# Patient Record
Sex: Male | Born: 1975 | Hispanic: Yes | Marital: Single | Smoking: Current every day smoker
Health system: Southern US, Community
[De-identification: ages and names within clinical notes are randomized; demographics above are authoritative.]

## PROBLEM LIST (undated history)

## (undated) DIAGNOSIS — M545 Low back pain, unspecified: Secondary | ICD-10-CM

## (undated) DIAGNOSIS — R011 Cardiac murmur, unspecified: Secondary | ICD-10-CM

## (undated) DIAGNOSIS — K219 Gastro-esophageal reflux disease without esophagitis: Secondary | ICD-10-CM

## (undated) DIAGNOSIS — K279 Peptic ulcer, site unspecified, unspecified as acute or chronic, without hemorrhage or perforation: Secondary | ICD-10-CM

## (undated) DIAGNOSIS — Z8614 Personal history of Methicillin resistant Staphylococcus aureus infection: Secondary | ICD-10-CM

## (undated) HISTORY — PX: APPENDECTOMY: SHX54

---

## 2006-03-24 ENCOUNTER — Other Ambulatory Visit: Payer: Self-pay

## 2006-03-24 ENCOUNTER — Emergency Department: Payer: Self-pay | Admitting: Emergency Medicine

## 2006-09-11 ENCOUNTER — Emergency Department: Payer: Self-pay | Admitting: Emergency Medicine

## 2007-12-25 ENCOUNTER — Emergency Department: Payer: Self-pay | Admitting: Emergency Medicine

## 2007-12-25 ENCOUNTER — Other Ambulatory Visit: Payer: Self-pay

## 2012-06-22 ENCOUNTER — Emergency Department: Payer: Self-pay | Admitting: Emergency Medicine

## 2012-08-22 DIAGNOSIS — Z8614 Personal history of Methicillin resistant Staphylococcus aureus infection: Secondary | ICD-10-CM

## 2012-08-22 HISTORY — DX: Personal history of Methicillin resistant Staphylococcus aureus infection: Z86.14

## 2013-09-13 ENCOUNTER — Emergency Department: Payer: Self-pay | Admitting: Emergency Medicine

## 2013-09-13 LAB — URINALYSIS, COMPLETE
BLOOD: NEGATIVE
Bacteria: NONE SEEN
Bilirubin,UR: NEGATIVE
GLUCOSE, UR: NEGATIVE mg/dL (ref 0–75)
Ketone: NEGATIVE
Leukocyte Esterase: NEGATIVE
NITRITE: NEGATIVE
Ph: 6 (ref 4.5–8.0)
Protein: NEGATIVE
Specific Gravity: 1.018 (ref 1.003–1.030)
Squamous Epithelial: <1
WBC UR: 3 /HPF (ref 0–5)

## 2013-09-13 LAB — COMPREHENSIVE METABOLIC PANEL
AST: 9 U/L — AB (ref 15–37)
Albumin: 4.2 g/dL (ref 3.4–5.0)
Alkaline Phosphatase: 48 U/L
Anion Gap: 1 — ABNORMAL LOW (ref 7–16)
BUN: 8 mg/dL (ref 7–18)
Bilirubin,Total: 0.8 mg/dL (ref 0.2–1.0)
CHLORIDE: 105 mmol/L (ref 98–107)
CO2: 32 mmol/L (ref 21–32)
CREATININE: 1.07 mg/dL (ref 0.60–1.30)
Calcium, Total: 8.8 mg/dL (ref 8.5–10.1)
EGFR (Non-African Amer.): >60
GLUCOSE: 92 mg/dL (ref 65–99)
Osmolality: 274 (ref 275–301)
Potassium: 3.9 mmol/L (ref 3.5–5.1)
SGPT (ALT): 25 U/L (ref 12–78)
Sodium: 138 mmol/L (ref 136–145)
TOTAL PROTEIN: 7.7 g/dL (ref 6.4–8.2)

## 2013-09-13 LAB — LIPASE, BLOOD: LIPASE: 139 U/L (ref 73–393)

## 2013-11-04 ENCOUNTER — Emergency Department: Payer: Self-pay | Admitting: Emergency Medicine

## 2013-11-04 LAB — COMPREHENSIVE METABOLIC PANEL
Albumin: 3.8 g/dL (ref 3.4–5.0)
Alkaline Phosphatase: 52 U/L
Anion Gap: 1 — ABNORMAL LOW (ref 7–16)
BILIRUBIN TOTAL: 0.3 mg/dL (ref 0.2–1.0)
BUN: 10 mg/dL (ref 7–18)
CHLORIDE: 107 mmol/L (ref 98–107)
Calcium, Total: 8.3 mg/dL — ABNORMAL LOW (ref 8.5–10.1)
Co2: 31 mmol/L (ref 21–32)
Creatinine: 1.11 mg/dL (ref 0.60–1.30)
EGFR (African American): >60
EGFR (Non-African Amer.): >60
Glucose: 100 mg/dL — ABNORMAL HIGH (ref 65–99)
Osmolality: 277 (ref 275–301)
Potassium: 4.1 mmol/L (ref 3.5–5.1)
SGOT(AST): 16 U/L (ref 15–37)
SGPT (ALT): 21 U/L (ref 12–78)
Sodium: 139 mmol/L (ref 136–145)
Total Protein: 7.1 g/dL (ref 6.4–8.2)

## 2013-11-04 LAB — URINALYSIS, COMPLETE
BACTERIA: NONE SEEN
BLOOD: NEGATIVE
Bilirubin,UR: NEGATIVE
Glucose,UR: NEGATIVE mg/dL (ref 0–75)
KETONE: NEGATIVE
LEUKOCYTE ESTERASE: NEGATIVE
Nitrite: NEGATIVE
Ph: 5 (ref 4.5–8.0)
Protein: NEGATIVE
RBC,UR: 2 /HPF (ref 0–5)
Specific Gravity: 1.025 (ref 1.003–1.030)
WBC UR: 1 /HPF (ref 0–5)

## 2013-11-04 LAB — CBC WITH DIFFERENTIAL/PLATELET
Basophil #: 0.1 10*3/uL (ref 0.0–0.1)
Basophil %: 0.6 %
Eosinophil #: 0.6 10*3/uL (ref 0.0–0.7)
Eosinophil %: 4.8 %
HCT: 45.5 % (ref 40.0–52.0)
HGB: 15.3 g/dL (ref 13.0–18.0)
LYMPHS PCT: 21.1 %
Lymphocyte #: 2.5 10*3/uL (ref 1.0–3.6)
MCH: 32.2 pg (ref 26.0–34.0)
MCHC: 33.5 g/dL (ref 32.0–36.0)
MCV: 96 fL (ref 80–100)
MONO ABS: 0.8 x10 3/mm (ref 0.2–1.0)
MONOS PCT: 6.7 %
NEUTROS PCT: 66.8 %
Neutrophil #: 7.9 10*3/uL — ABNORMAL HIGH (ref 1.4–6.5)
PLATELETS: 250 10*3/uL (ref 150–440)
RBC: 4.74 10*6/uL (ref 4.40–5.90)
RDW: 13 % (ref 11.5–14.5)
WBC: 11.8 10*3/uL — ABNORMAL HIGH (ref 3.8–10.6)

## 2014-06-24 ENCOUNTER — Emergency Department: Payer: Self-pay | Admitting: Emergency Medicine

## 2015-06-10 ENCOUNTER — Emergency Department
Admission: EM | Admit: 2015-06-10 | Discharge: 2015-06-11 | Disposition: A | Discharge status: Home / Self Care | Payer: Self-pay | Attending: Emergency Medicine | Admitting: Emergency Medicine

## 2015-06-10 ENCOUNTER — Encounter: Payer: Self-pay | Admitting: *Deleted

## 2015-06-10 DIAGNOSIS — Z720 Tobacco use: Secondary | ICD-10-CM | POA: Insufficient documentation

## 2015-06-10 DIAGNOSIS — Y9389 Activity, other specified: Secondary | ICD-10-CM | POA: Insufficient documentation

## 2015-06-10 DIAGNOSIS — T675XXA Heat exhaustion, unspecified, initial encounter: Secondary | ICD-10-CM | POA: Insufficient documentation

## 2015-06-10 DIAGNOSIS — Y998 Other external cause status: Secondary | ICD-10-CM | POA: Insufficient documentation

## 2015-06-10 DIAGNOSIS — X30XXXA Exposure to excessive natural heat, initial encounter: Secondary | ICD-10-CM | POA: Insufficient documentation

## 2015-06-10 DIAGNOSIS — Y9289 Other specified places as the place of occurrence of the external cause: Secondary | ICD-10-CM | POA: Insufficient documentation

## 2015-06-10 HISTORY — DX: Peptic ulcer, site unspecified, unspecified as acute or chronic, without hemorrhage or perforation: K27.9

## 2015-06-10 MED ORDER — SODIUM CHLORIDE 0.9 % IV BOLUS (SEPSIS)
1000.0000 mL | Freq: Once | INTRAVENOUS | Status: AC
  Administered 2015-06-10: 1000 mL via INTRAVENOUS

## 2015-06-10 NOTE — ED Provider Notes (Signed)
[facility] Emergency Department Provider Note  ____________________________________________   I have reviewed the triage vital signs and the nursing notes.   HISTORY  Chief Complaint Dizziness    HPI Nicolas Tapia is a 39 y.o. 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 Did not fall, his friend was with him. He sagged and woke up immediately. No seizure. No postictal period at this time he feels much better. He did have some nausea, he states he was feeling overheated before it happened but otherwise he was in his normal state of health. He is a healthy person, he denies any chest pain shows breath nausea vomiting or melena bright red blood per rectum or focal neurologic symptoms. He denies headache. Patient states he is a free mason and he is adamant that I am not allowed to draw any blood because his blood is sacred  Past Medical History  Diagnosis Date  . Peptic ulcer     There are no active problems to display for this patient.   Past Surgical History  Procedure Laterality Date  . Appendectomy      No current outpatient prescriptions on file.  Allergies Review of patient's allergies indicates no known allergies.  No family history on file.  Social History Social History  Substance Use Topics  . Smoking status: Current Every Day Smoker  . Smokeless tobacco: None  . Alcohol Use: No    Review of Systems Constitutional: No fever/chills Eyes: No visual changes. ENT: No sore throat. No stiff neck no neck pain Cardiovascular: Denies chest pain. Respiratory: Denies shortness of breath. Gastrointestinal:  Positivevomiting.  No diarrhea.  No constipation. Genitourinary: Negative for dysuria. Musculoskeletal: Negative lower extremity swelling Skin: Negative for rash. Neurological: Negative for headaches, focal weakness or numbness. 10-point ROS otherwise  negative.  ____________________________________________   PHYSICAL EXAM:  VITAL SIGNS: ED Triage Vitals  Enc Vitals Group     BP 06/10/15 1639 132/84 mmHg     Pulse Rate 06/10/15 1639 84     Resp 06/10/15 1639 18     Temp 06/10/15 1639 98.2 F (36.8 C)     Temp Source 06/10/15 1639 Oral     SpO2 06/10/15 1639 99 %     Weight --      Height --      Head Cir --      Peak Flow --      Pain Score 06/10/15 1637 7     Pain Loc --      Pain Edu? --      Excl. in GC? --     Constitutional: Alert and oriented. Well appearing and in no acute distress. Eyes: Conjunctivae are normal. PERRL. EOMI. Head: Atraumatic. Nose: No congestion/rhinnorhea. Mouth/Throat: Mucous membranes are moist.  Oropharynx non-erythematous. Neck: No stridor.   Nontender with no meningismus Cardiovascular: Normal rate, regular rhythm. Grossly normal heart sounds.  Good peripheral circulation. Respiratory: Normal respiratory effort.  No retractions. Lungs CTAB. Gastrointestinal: Soft and nontender. No distention. No guarding no rebound Back:  There is no focal tenderness or step off there is no midline tenderness there are no lesions noted. there is no CVA tenderness Musculoskeletal: No lower extremity tenderness. No joint effusions, no DVT signs strong distal pulses no edema Neurologic:  Normal speech and language. No gross focal neurologic deficits are appreciated.  Skin:  Skin is warm, dry and intact. No rash noted. Psychiatric: Mood  and affect are normal. Speech and behavior are normal.  ____________________________________________   LABS (all labs ordered are listed, but only abnormal results are displayed)  Labs Reviewed  BASIC METABOLIC PANEL  CBC  URINALYSIS COMPLETEWITH MICROSCOPIC ([facility] ONLY)  CBG MONITORING, ED    ____________________________________________  EKG   ____________________________________________  RADIOLOGY   ____________________________________________   PROCEDURES  Procedure(s) performed: None  Critical Care performed: None  ____________________________________________   INITIAL IMPRESSION / ASSESSMENT AND PLAN / ED COURSE  Pertinent labs & imaging results that were available during my care of the patient were reviewed by me and considered in my medical decision making (see chart for details).   patient has symptoms of heat exhaustion, other pathologies or possible however he refuses any blood draws which means I cannot rule out for example anemia or cardiac enzyme elevation etc. I have low suspicion for these entities in any event. Nonetheless as he refuses blood work I have explained him that this does limit my ability to evaluate him and diagnosed him with possibly significant pathology that could cause him to feel lightheaded and vomited at work. Patient's very comfortable with the limitations of this workup and he does consent to having IV fluid which we'll provide. He is otherwise in no acute distress. And he feels much better after getting out of the heat.  ___________________________________________   FINAL CLINICAL IMPRESSION(S) / ED DIAGNOSES  Final diagnoses:  None     Jeanmarie PlantJames A Damira Kem, MD 06/10/15 [id number]

## 2015-06-10 NOTE — Discharge Instructions (Signed)
Heat Exhaustion Information Our workup here is limited because you declined to have any blood drawn if you feel worse or change your mind return to the emergency department.  WHAT IS HEAT EXHAUSTION? Heat exhaustion happens when your body gets overheated from hot weather or from exercise. Heat exhaustion makes the temperature of your skin and body go up. Your body cools itself by sweating. If you do not drink enough water to replace what you sweat, you lose too much water and salt. This makes it harder for your body to produce more sweat. When you do not sweat enough, your body cannot cool down, and heat exhaustion may result. Heat exhaustion can lead to heatstroke, which is a more serious illness. WHO IS AT RISK FOR HEAT EXHAUSTION? Anyone can get heat exhaustion. However, heat exhaustion is more likely when you are exercising or doing a physical activity. It is also more likely when you are in hot and humid weather or bright sunshine. Heat exhaustion is also more likely to develop in:  People who are age 72 or older.  Children.  People who have a medical condition such as heart disease, poor circulation, sickle cell disease, or high blood pressure.  People who have a fever.  People who are very overweight (obese).  People who are dehydrated from:  Drinking alcohol or caffeine.  Taking certain medicines, such as diuretics or stimulants. WHAT ARE THE SYMPTOMS OF HEAT EXHAUSTION? Symptoms of heat exhaustion include:  A body temperature of up to 104F (40C).  Moist, cool, and clammy skin.  Dizziness.  Headache.  Nausea.  Fatigue.  Thirst.  Dark-colored urine.  Rapid pulse or heartbeat.  Weakness.  Muscle cramps.  Confusion.  Fainting. WHAT SHOULD I DO IF I THINK I HAVE HEAT EXHAUSTION? If you think that you have heat exhaustion, call your health care provider. Follow his or her instructions. You should also:  Call a friend or a family member and ask someone to  stay with you.  Move to a cooler location, such as:  Into the shade.  In front of a fan.  Someplace that has air conditioning.  Lie down and rest.  Slowly drink nonalcoholic, caffeine-free fluids.  Take off any extra clothing or tight-fitting clothes.  Take a cool bath or shower, if possible. If you do not have access to a bath or shower, dab or mist cool water on your skin. WHY IS IT IMPORTANT TO TREAT HEAT EXHAUSTION? It is extremely important to take care of yourself and treat heat exhaustion as soon as possible. Untreated heat exhaustion can turn into heatstroke. Symptoms of heatstroke include:  A body temperature of 104F (40C) or higher.  Hot, red skin that may be dry or moist.  Severe headache.  Nausea and vomiting.  Muscle weakness and cramping.  Confusion.  Rapid breathing.  Fainting.  Seizure. These symptoms may represent a serious problem that is an emergency. Do not wait to see if the symptoms will go away. Get medical help right away. Call your local emergency services (911 in the [place] Do not drive yourself to the hospital. Heatstroke is a life-threatening condition that requires urgent medical treatment. Do not treat heatstroke at home. Heatstroke should be treated by a health care professional and may require hospitalization. At the hospital, you may need to receive fluids through an IV tube:  If you cannot drink any fluids.  If you vomit any fluids that you drink.  If your symptoms do not get better after one  hour.  If your symptoms get worse after one hour. HOW CAN I PREVENT HEAT EXHAUSTION?  Avoid outdoor activities on very hot or humid days.  Do not exercise or do other physical activity when you are not feeling well.  Drink plenty of nonalcoholic and caffeine-free fluids before and during physical activity.  Take frequent breaks for rest during physical activity.  Wear light-colored, loose-fitting, and lightweight clothing in the  heat.  Wear a hat and use sunscreen when exercising outdoors.  Avoid being outside during the hottest times of the day.  Check with your health care provider before you start any new activity, especially if you take medicine or have a medical condition.  Start any new activity slowly and work up to your fitness level. HOW CAN I HELP TO PROTECT ELDERLY RELATIVES AND NEIGHBORS FROM HEAT EXHAUSTION? People who are age 40 or older are at greater risk for heat exhaustion. Their bodies have a harder time adjusting to heat. They are also more likely to have a medical condition or be on medicines that increase their risk for heat exhaustion. They may get heat exhaustion indoors if the heat is high for several days. You can help to protect them during hot weather by:  Checking on them two or more times each day.  Making sure that they are drinking plenty of cool, nonalcoholic, and caffeine-free fluids.  Making sure that they use their air conditioner.  Taking them to a location where air conditioning is available.  Talking with their health care provider about their medical needs, medicines, and fluid requirements.   This information is not intended to replace advice given to you by your health care provider. Make sure you discuss any questions you have with your health care provider.   Document Released: 05/17/2008 Document Revised: 04/29/2015 Document Reviewed: 07/16/2014 Elsevier Interactive Patient Education [employer]2016 Else[employer] Inc.

## 2015-06-10 NOTE — ED Notes (Signed)
Pt reports getting very hot today at work and feeling weak and dizzy. Denies nausea and vomiting. Denies LOC. States that he has some mild abdominal pain. Pt reports history of peptic ulcer disease.

## 2015-06-10 NOTE — ED Notes (Signed)
Pt reports dizziness while working outside. Pain in abdomen, generalized. Nausea and vomiting.

## 2015-06-10 NOTE — ED Notes (Signed)
Pt refusing to have blood drawn. States "a mason's blood is sacred". Wants to talk to MD prior to having blood drawn.

## 2016-06-13 ENCOUNTER — Emergency Department
Admission: EM | Admit: 2016-06-13 | Discharge: 2016-06-13 | Disposition: A | Discharge status: Home / Self Care | Payer: Self-pay | Attending: Emergency Medicine | Admitting: Emergency Medicine

## 2016-06-13 ENCOUNTER — Encounter: Payer: Self-pay | Admitting: Emergency Medicine

## 2016-06-13 ENCOUNTER — Emergency Department: Payer: Self-pay

## 2016-06-13 DIAGNOSIS — F1290 Cannabis use, unspecified, uncomplicated: Secondary | ICD-10-CM | POA: Insufficient documentation

## 2016-06-13 DIAGNOSIS — R55 Syncope and collapse: Secondary | ICD-10-CM | POA: Insufficient documentation

## 2016-06-13 DIAGNOSIS — F17200 Nicotine dependence, unspecified, uncomplicated: Secondary | ICD-10-CM | POA: Insufficient documentation

## 2016-06-13 LAB — URINALYSIS COMPLETE WITH MICROSCOPIC (ARMC ONLY)
BILIRUBIN URINE: NEGATIVE
Bacteria, UA: NONE SEEN
GLUCOSE, UA: NEGATIVE mg/dL
Hgb urine dipstick: NEGATIVE
KETONES UR: NEGATIVE mg/dL
Leukocytes, UA: NEGATIVE
Nitrite: NEGATIVE
Protein, ur: NEGATIVE mg/dL
SQUAMOUS EPITHELIAL / LPF: NONE SEEN
Specific Gravity, Urine: 1.018 (ref 1.005–1.030)
pH: 6 (ref 5.0–8.0)

## 2016-06-13 LAB — BASIC METABOLIC PANEL
Anion gap: 5 (ref 5–15)
BUN: 14 mg/dL (ref 6–20)
CALCIUM: 8.8 mg/dL — AB (ref 8.9–10.3)
CO2: 27 mmol/L (ref 22–32)
CREATININE: 0.93 mg/dL (ref 0.61–1.24)
Chloride: 108 mmol/L (ref 101–111)
GFR calc non Af Amer: >60 mL/min (ref >60)
Glucose, Bld: 71 mg/dL (ref 65–99)
Potassium: 4.6 mmol/L (ref 3.5–5.1)
Sodium: 140 mmol/L (ref 135–145)

## 2016-06-13 LAB — CBC
HCT: 44.8 % (ref 40.0–52.0)
Hemoglobin: 15.4 g/dL (ref 13.0–18.0)
MCH: 33.2 pg (ref 26.0–34.0)
MCHC: 34.5 g/dL (ref 32.0–36.0)
MCV: 96.1 fL (ref 80.0–100.0)
PLATELETS: 240 10*3/uL (ref 150–440)
RBC: 4.66 MIL/uL (ref 4.40–5.90)
RDW: 13.3 % (ref 11.5–14.5)
WBC: 8.8 10*3/uL (ref 3.8–10.6)

## 2016-06-13 LAB — CK: Total CK: 77 U/L (ref 49–397)

## 2016-06-13 LAB — TROPONIN I: Troponin I: <0.03 ng/mL (ref <0.03)

## 2016-06-13 LAB — GLUCOSE, CAPILLARY: Glucose-Capillary: 125 mg/dL — ABNORMAL HIGH (ref 65–99)

## 2016-06-13 MED ORDER — SODIUM CHLORIDE 0.9 % IV BOLUS (SEPSIS)
30.0000 mL/kg | Freq: Once | INTRAVENOUS | Status: AC
  Administered 2016-06-13: 2244 mL via INTRAVENOUS

## 2016-06-13 NOTE — ED Notes (Signed)
Patient transported to CT 

## 2016-06-13 NOTE — ED Provider Notes (Signed)
[facility] Emergency Department Provider Note   ____________________________________________   First MD Initiated Contact with Patient 06/13/16 1545     (approximate)  I have reviewed the triage vital signs and the nursing notes.   HISTORY  Chief Complaint Loss of Consciousness    HPI Nicolas Tapia is a 40 y.o. male who reports Saturday he had been out with friends had 2 or 3 alcoholic drinks and smokes marijuana. He is at the bar had another drink which was much much stronger than it was supposed to be. He had just a little bit of it and then pushed it away. He then got very hungry and wanted to leave but the ladies he was with were busy talking and wouldn't leave then he got tingling in his 4 head that went up over his scalp down to his neck and he passed out. He reports he was told he got very pale and was a little bit sweaty. Is weak and achy all over. He said he maybe for 5 or 10 minutes. He has a little bit of epigastric pain. He says he has a peptic ulcer that feels like what it is. He has not had any black tarry stools or blood in his stools.  Past Medical History:  Diagnosis Date  . Peptic ulcer     There are no active problems to display for this patient.   Past Surgical History:  Procedure Laterality Date  . APPENDECTOMY      Prior to Admission medications   Not on File    Allergies Review of patient's allergies indicates no known allergies.  No family history on file.  Social History Social History  Substance Use Topics  . Smoking status: Current Every Day Smoker  . Smokeless tobacco: Never Used  . Alcohol use Yes    Review of Systems Constitutional: No fever/chills Eyes: No visual changes. ENT: No sore throat. Cardiovascular: Denies chest pain. Respiratory: Denies shortness of breath. Gastrointestinal: See history of present illness  No nausea, no vomiting.  No diarrhea.  No constipation. Genitourinary: Negative for  dysuria. Musculoskeletal: Negative for back pain. Skin: Negative for rash. Neurological: Negative for headaches, focal weakness or numbness.  10-point ROS otherwise negative.  ____________________________________________   PHYSICAL EXAM:  VITAL SIGNS: ED Triage Vitals  Enc Vitals Group     BP 06/13/16 1248 124/87     Pulse Rate 06/13/16 1248 63     Resp 06/13/16 1248 18     Temp 06/13/16 1248 97.9 F (36.6 C)     Temp Source 06/13/16 1248 Oral     SpO2 06/13/16 1248 100 %     Weight 06/13/16 1249 165 lb (74.8 kg)     Height 06/13/16 1249 5\' 10"  (1.778 m)     Head Circumference --      Peak Flow --      Pain Score 06/13/16 1249 8     Pain Loc --      Pain Edu? --      Excl. in GC? --     Constitutional: Alert and oriented. Well appearing and in no acute distress. Eyes: Conjunctivae are normal. PERRL. EOMI.Fundi appear normal Head: Atraumatic. Nose: No congestion/rhinnorhea. Mouth/Throat: Mucous membranes are moist.  Oropharynx non-erythematous. Neck: No stridor.  No cervical spine tenderness to palpation. Hematological/Lymphatic/Immunilogical: No cervical lymphadenopathy. Cardiovascular: Normal rate, regular rhythm. Grossly normal heart sounds.  Good peripheral circulation. Respiratory: Normal respiratory effort.  No retractions. Lungs CTAB. Gastrointestinal: Soft and nontender.  No distention. No abdominal bruits. No CVA tenderness. Musculoskeletal: No lower extremity tenderness nor edema.  No joint effusions. Patient does have some pain in the left flank over the paraspinous muscles laterally. There is no ecchymosis there. There is no pain anywhere else. There is no pain on palpation anteriorly. Neurologic:  Normal speech and language. No gross focal neurologic deficits are appreciated. Cranial nerves II through XII are intact cerebellar finger-nose rapid alternating movements and hands are normal motor strength is 5 over 5 throughout sensation is intact throughout No gait  instability. Skin:  Skin is warm, dry and intact. No rash noted. Psychiatric: Mood and affect are normal. Speech and behavior are normal.  ____________________________________________   LABS (all labs ordered are listed, but only abnormal results are displayed)  Labs Reviewed  GLUCOSE, CAPILLARY - Abnormal; Notable for the following:       Result Value   Glucose-Capillary 125 (*)    All other components within normal limits  BASIC METABOLIC PANEL - Abnormal; Notable for the following:    Calcium 8.8 (*)    All other components within normal limits  URINALYSIS COMPLETEWITH MICROSCOPIC ([facility] ONLY) - Abnormal; Notable for the following:    Color, Urine YELLOW (*)    APPearance CLEAR (*)    All other components within normal limits  CBC  TROPONIN I  CK   ____________________________________________  EKG  KG read and interpreted by me shows normal sinus rhythm at 64 normal axis. There is apparently early repolarization the lateral chest leads. EKG looks similar to previous one. ____________________________________________  RADIOLOGY  Study Result   CLINICAL DATA:  40 year old male with history of syncopal episode 2 days ago.  EXAM: CT HEAD WITHOUT CONTRAST  TECHNIQUE: Contiguous axial images were obtained from the base of the skull through the vertex without intravenous contrast.  COMPARISON:  Head CT 03/24/2006.  FINDINGS: Brain: No evidence of acute infarction, hemorrhage, hydrocephalus, extra-axial collection or mass lesion/mass effect.  Vascular: No hyperdense vessel or unexpected calcification.  Skull: Normal. Negative for fracture or focal lesion.  Sinuses/Orbits: Multifocal mucosal thickening and opacification in the ethmoid sinuses bilaterally.  Other: None.  IMPRESSION: 1. No acute intracranial abnormalities. 2. Paranasal sinus disease, as above, without acute features.   Electronically Signed   By: Trudie Reedaniel  Entrikin M.D.   On:  06/13/2016 16:06    Study Result   CLINICAL DATA:  Syncope.  EXAM: CHEST  2 VIEW  COMPARISON:  05/13/2002 report.  FINDINGS: Mediastinum hilar structures normal. Lungs are clear. No pleural effusion or pneumothorax. Heart size normal. No acute bony abnormality.  IMPRESSION: No acute cardiopulmonary disease.   Electronically Signed   By: Maisie Fushomas  Register   On: 06/13/2016 16:09     ____________________________________________   PROCEDURES  Procedure(s) performed:   Procedures  Critical Care performed:   ____________________________________________   INITIAL IMPRESSION / ASSESSMENT AND PLAN / ED COURSE  Pertinent labs & imaging results that were available during my care of the patient were reviewed by me and considered in my medical decision making (see chart for details).    Clinical Course   At present patient feels fine. He still little bit achy.  ____________________________________________   FINAL CLINICAL IMPRESSION(S) / ED DIAGNOSES  Final diagnoses:  Syncope, unspecified syncope type      NEW MEDICATIONS STARTED DURING THIS VISIT:  There are no discharge medications for this patient.    Note:  This document was prepared using Dragon voice recognition software and may include unintentional  dictation errors.    Arnaldo Natal, MD 06/13/16 [id number]

## 2016-06-13 NOTE — ED Notes (Signed)
Pt in via triage with complaints of LOC Saturday night while he was out at a bar, reports feeling tingly feeling across forehead and then passing out.  Pt reports feeling weak since then, not having the energy he typically does.  Pt A/Ox4, vitals WDL, no immediate distress at this time.

## 2016-06-13 NOTE — Discharge Instructions (Signed)
Try to avoid drinking so much. He careful at the marijuana use is not mixed with anything else. Remember the monitor and marijuana is much stronger than the old stuff and can cause many side effects. Her had been reports of heart attacks with marijuana nowadays.

## 2016-06-13 NOTE — ED Triage Notes (Signed)
Pt presents to ED with reports of having a syncopal episode Saturday night. Pt reports he had 2-3 alcoholic drinks and smoked some marijuana. Pt reports while out with friends he suddenly felt very hungry and then felt needle sensations from his forehead to the back of his head and then he passed out. Pt reports he was out for about five minutes. Pt denies any other episodes of passing out. Pt states he just feels weak and wanted to get checked out.

## 2016-06-13 NOTE — ED Notes (Signed)
Pt refuses to give urine specimen.  Per ED Medic pt states, "I am not going to give a urine sample."  Upon assessing the situation, pt states, "I'm just not going to be able to pee."  MD notified.

## 2016-06-22 ENCOUNTER — Emergency Department
Admission: EM | Admit: 2016-06-22 | Discharge: 2016-06-22 | Disposition: A | Discharge status: Home / Self Care | Payer: Self-pay | Attending: Emergency Medicine | Admitting: Emergency Medicine

## 2016-06-22 ENCOUNTER — Encounter: Payer: Self-pay | Admitting: *Deleted

## 2016-06-22 ENCOUNTER — Emergency Department: Payer: Self-pay

## 2016-06-22 DIAGNOSIS — M545 Low back pain: Secondary | ICD-10-CM | POA: Insufficient documentation

## 2016-06-22 DIAGNOSIS — F17200 Nicotine dependence, unspecified, uncomplicated: Secondary | ICD-10-CM | POA: Insufficient documentation

## 2016-06-22 DIAGNOSIS — R0781 Pleurodynia: Secondary | ICD-10-CM | POA: Insufficient documentation

## 2016-06-22 DIAGNOSIS — M546 Pain in thoracic spine: Secondary | ICD-10-CM | POA: Insufficient documentation

## 2016-06-22 LAB — URINALYSIS COMPLETE WITH MICROSCOPIC (ARMC ONLY)
Bacteria, UA: NONE SEEN
Bilirubin Urine: NEGATIVE
Glucose, UA: NEGATIVE mg/dL
HGB URINE DIPSTICK: NEGATIVE
KETONES UR: NEGATIVE mg/dL
LEUKOCYTES UA: NEGATIVE
NITRITE: NEGATIVE
PH: 5 (ref 5.0–8.0)
PROTEIN: NEGATIVE mg/dL
SPECIFIC GRAVITY, URINE: 1.027 (ref 1.005–1.030)

## 2016-06-22 MED ORDER — CYCLOBENZAPRINE HCL 5 MG PO TABS: 5.0000 mg | ORAL_TABLET | Freq: Three times a day (TID) | ORAL | 0 refills | Status: DC | PRN

## 2016-06-22 MED ORDER — NAPROXEN 500 MG PO TABS: 500.0000 mg | ORAL_TABLET | Freq: Two times a day (BID) | ORAL | 0 refills | Status: DC

## 2016-06-22 MED ORDER — HYDROCODONE-ACETAMINOPHEN 5-325 MG PO TABS: 1.0000 | ORAL_TABLET | ORAL | 0 refills | Status: DC | PRN

## 2016-06-22 MED ORDER — DIAZEPAM 2 MG PO TABS
2.0000 mg | ORAL_TABLET | Freq: Once | ORAL | Status: AC
  Administered 2016-06-22: 2 mg via ORAL
  Filled 2016-06-22: qty 1

## 2016-06-22 MED ORDER — KETOROLAC TROMETHAMINE 30 MG/ML IJ SOLN
30.0000 mg | Freq: Once | INTRAMUSCULAR | Status: AC
  Administered 2016-06-22: 30 mg via INTRAMUSCULAR
  Filled 2016-06-22: qty 1

## 2016-06-22 MED ORDER — HYDROCODONE-ACETAMINOPHEN 5-325 MG PO TABS
2.0000 | ORAL_TABLET | Freq: Once | ORAL | Status: AC
  Administered 2016-06-22: 2 via ORAL
  Filled 2016-06-22: qty 2

## 2016-06-22 NOTE — ED Provider Notes (Signed)
[facility] Emergency Department Provider Note  ____________________________________________   First MD Initiated Contact with Patient 06/22/16 1016     (approximate)  I have reviewed the triage vital signs and the nursing notes.   HISTORY  Chief Complaint Back Pain   HPI Nicolas Tapia is a 40 y.o. male is here with complaint of right-sided back pain for several days. Patient states that he has not had a injury that would cause his symptoms. Patient states that his pain is much worse with specific movements. He denies any urinary symptoms, incontinence of bowel or bladder or paresthesias. Patient tried over-the-counter medication without any relief. He denies any previous problems with his back. Patient is continued to be ambulatory without assistance prior to his arrival in the emergency room. He rates his pain at 10 over 10.   Past Medical History:  Diagnosis Date  . Peptic ulcer     There are no active problems to display for this patient.   Past Surgical History:  Procedure Laterality Date  . APPENDECTOMY      Prior to Admission medications   Medication Sig Start Date End Date Taking? Authorizing Provider  cyclobenzaprine (FLEXERIL) 5 MG tablet Take 1 tablet (5 mg total) by mouth 3 (three) times daily as needed for muscle spasms. 06/22/16   Tommi Rumpshonda L Kavin Weckwerth, PA-C  HYDROcodone-acetaminophen (NORCO/VICODIN) 5-325 MG tablet Take 1 tablet by mouth every 4 (four) hours as needed for moderate pain. 06/22/16   Tommi Rumpshonda L Masayo Fera, PA-C  naproxen (NAPROSYN) 500 MG tablet Take 1 tablet (500 mg total) by mouth 2 (two) times daily with a meal. 06/22/16   Tommi Rumpshonda L Wenda Vanschaick, PA-C    Allergies Review of patient's allergies indicates no known allergies.  History reviewed. No pertinent family history.  Social History Social History  Substance Use Topics  . Smoking status: Current Every Day Smoker  . Smokeless tobacco: Never Used  . Alcohol use Yes    Review  of Systems Constitutional: No fever/chills Cardiovascular: Denies chest pain. Respiratory: Denies shortness of breath. Gastrointestinal: No abdominal pain.  No nausea, no vomiting.  No diarrhea.   Genitourinary: Negative for dysuria. Musculoskeletal: Positive for back pain. Skin: Negative for rash. Neurological: Negative for headaches, focal weakness or numbness.  10-point ROS otherwise negative.  ____________________________________________   PHYSICAL EXAM:  VITAL SIGNS: ED Triage Vitals [06/22/16 0959]  Enc Vitals Group     BP 97/69     Pulse Rate 61     Resp 18     Temp 98.1 F (36.7 C)     Temp Source Oral     SpO2 99 %     Weight 165 lb (74.8 kg)     Height 5\' 10"  (1.778 m)     Head Circumference      Peak Flow      Pain Score 10     Pain Loc      Pain Edu?      Excl. in GC?     Constitutional: Alert and oriented. Well appearing and in no acute distress. Eyes: Conjunctivae are normal. PERRL. EOMI. Head: Atraumatic. Nose: No congestion/rhinnorhea. Neck: No stridor.   Cardiovascular: Normal rate, regular rhythm. Grossly normal heart sounds.  Good peripheral circulation. Respiratory: Normal respiratory effort.  No retractions. Lungs CTAB. There is some minimal tenderness on palpation of the right lateral ribs without any soft tissue edema or ecchymosis noted. Gastrointestinal: Soft and nontender. No distention. Bowel sounds normoactive 4 quadrants. Musculoskeletal: On examination of  the back there is no gross deformity and minimal tenderness on palpation of the thoracic and lumbar spine posteriorly. Range of motion is restricted secondary to discomfort. No active muscle spasms are seen the patient is guarding. Patient remained in wheelchair and states that he is more, comfortable "sitting still".  Neurologic:  Normal speech and language. No gross focal neurologic deficits are appreciated. No gait instability. Skin:  Skin is warm, dry and intact. No rash noted. No  ecchymosis or abrasions were noted. Psychiatric: Mood and affect are normal. Speech and behavior are normal.  ____________________________________________   LABS (all labs ordered are listed, but only abnormal results are displayed)  Labs Reviewed  URINALYSIS COMPLETEWITH MICROSCOPIC ([facility] ONLY) - Abnormal; Notable for the following:       Result Value   Color, Urine YELLOW (*)    APPearance CLEAR (*)    Squamous Epithelial / LPF 0-5 (*)    All other components within normal limits   _ RADIOLOGY Right rib x-ray per radiologist was negative for rib fracture. Thoracic spine x-ray per radiologist: IMPRESSION:  Mild anterior wedge compression deformity of T12, slightly  progressed since CT examination from March 2015.  Lumbar spine x-ray per radiologist: IMPRESSION:  Mild anterior wedge deformities of T12 and L1, deformity at T12  slightly progressed since CT 2015. Otherwise no acute osseous  abnormality.   I, Tommi Rumpshonda L Jenene Kauffmann, personally viewed and evaluated these images (plain radiographs) as part of my medical decision making, as well as reviewing the written report by the radiologist.  ____________________________________________   PROCEDURES  Procedure(s) performed: None  Procedures  Critical Care performed: No  ____________________________________________   INITIAL IMPRESSION / ASSESSMENT AND PLAN / ED COURSE  Pertinent labs & imaging results that were available during my care of the patient were reviewed by me and considered in my medical decision making (see chart for details).    Clinical Course  Value Comment By Time  DG Ribs Unilateral W/Chest Right (Reviewed) Tommi Rumpshonda L Jakylah Bassinger, PA-C 11/01 1446  DG Lumbar Spine 2-3 Views (Reviewed) Tommi Rumpshonda L Johniece Hornbaker, PA-C 11/01 1526  DG Lumbar Spine 2-3 Views (Reviewed) Tommi Rumpshonda L Pualani Borah, PA-C 11/01 1733    Patient was given Valium 2 mg by mouth while in the emergency room along with Norco 2 tablets and Toradol 30 mg  IM. Patient states that he continues to have pain despite medication. We discussed the findings of his x-rays and the fact that he was seen by an orthopedist in 2015 for same symptoms. Patient is unable to recall the name of the orthopedist that he saw. We discussed his treatment plan of continuing the naproxen 500 mg twice a day with food along with Norco as needed for severe pain and Flexeril as needed for muscle spasms. He is encouraged to follow-up with the orthopedist he saw 2 years ago for Dr. Rosita Ke[place]Menz who [place] [facility]. ____________________________________________   FINAL CLINICAL IMPRESSION(S) / ED DIAGNOSES  Final diagnoses:  Acute right-sided thoracic back pain      NEW MEDICATIONS STARTED DURING THIS VISIT:  Discharge Medication List as of 06/22/2016  3:38 PM       Note:  This document was prepared using Dragon voice recognition software and may include unintentional dictation errors.    Tommi RumpsRhonda L Marija Calamari, PA-C 06/22/16 1735    Nita Sicklearolina Veronese, MD 06/23/16 2055

## 2016-06-22 NOTE — Discharge Instructions (Signed)
Call and make an appointment with the orthopedist Dr. Rosita KeaMenz who is on-call today. You will need to see him in the office for scheduled appointment. Take medication only as directed. Norco as needed for severe pain. Naproxen 500 mg twice a day with food. And Flexeril 3 times a day for muscle spasms. Do not take the Norco or Flexeril while driving or operating machinery as this may cause drowsiness.

## 2016-06-22 NOTE — ED Notes (Signed)
Pt given two blankets and pt advised that the pains meds that were given have not helped. Nurse notified.

## 2016-06-22 NOTE — ED Triage Notes (Signed)
Pt states left sided back pain for several days, states pain is worse with specific movements, denies any injury, denies any urinary symptoms

## 2016-06-22 NOTE — ED Notes (Signed)
Pt called mother and she advised that she would come pick pt up to give him a ride home. Pt had mother on speaker phone.

## 2017-08-30 ENCOUNTER — Emergency Department: Payer: Worker's Compensation

## 2017-08-30 ENCOUNTER — Emergency Department
Admission: EM | Admit: 2017-08-30 | Discharge: 2017-08-30 | Disposition: A | Discharge status: Home / Self Care | Payer: Worker's Compensation | Attending: Emergency Medicine | Admitting: Emergency Medicine

## 2017-08-30 DIAGNOSIS — S8992XA Unspecified injury of left lower leg, initial encounter: Secondary | ICD-10-CM | POA: Diagnosis present

## 2017-08-30 DIAGNOSIS — S8392XA Sprain of unspecified site of left knee, initial encounter: Secondary | ICD-10-CM | POA: Insufficient documentation

## 2017-08-30 DIAGNOSIS — Y990 Civilian activity done for income or pay: Secondary | ICD-10-CM | POA: Diagnosis not present

## 2017-08-30 DIAGNOSIS — F17200 Nicotine dependence, unspecified, uncomplicated: Secondary | ICD-10-CM | POA: Insufficient documentation

## 2017-08-30 DIAGNOSIS — Y9389 Activity, other specified: Secondary | ICD-10-CM | POA: Insufficient documentation

## 2017-08-30 DIAGNOSIS — Y929 Unspecified place or not applicable: Secondary | ICD-10-CM | POA: Diagnosis not present

## 2017-08-30 DIAGNOSIS — W1842XA Slipping, tripping and stumbling without falling due to stepping into hole or opening, initial encounter: Secondary | ICD-10-CM | POA: Insufficient documentation

## 2017-08-30 MED ORDER — HYDROCODONE-ACETAMINOPHEN 5-325 MG PO TABS: 1.0000 | ORAL_TABLET | ORAL | 0 refills | Status: DC | PRN

## 2017-08-30 MED ORDER — MELOXICAM 15 MG PO TABS: 15.0000 mg | ORAL_TABLET | Freq: Every day | ORAL | 0 refills | Status: DC

## 2017-08-30 MED ORDER — PREDNISONE 10 MG PO TABS: 10.0000 mg | ORAL_TABLET | Freq: Every day | ORAL | 0 refills | Status: DC

## 2017-08-30 MED ORDER — OXYCODONE-ACETAMINOPHEN 5-325 MG PO TABS
1.0000 | ORAL_TABLET | Freq: Once | ORAL | Status: AC
  Administered 2017-08-30: 1 via ORAL
  Filled 2017-08-30: qty 1

## 2017-08-30 NOTE — ED Notes (Signed)
UDS and breath analysis completed. Specimen walked down to lab and paperwork on patients chart.

## 2017-08-30 NOTE — ED Triage Notes (Signed)
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 States that his foot went in a hole and he fell hurting his lower left leg. He states that he thinks it is broken but there are no obvious deformities. VS WNL per EMS. Family at bedside.

## 2017-08-30 NOTE — ED Provider Notes (Signed)
[facility] Emergency Department Provider Note  ____________________________________________  Time seen: Approximately 9:01 PM  I have reviewed the triage vital signs and the nursing notes.   HISTORY  Chief Complaint Fall    HPI Nicolas Tapia is a 42 y.o. male who presents the emergency department complaining of left lower leg injury.  Patient was at work, pushing a heavy item on a wheeled dolly.  Patient reports that there was a hole in the floor that he did not observe, and he stepped into the hole.  Patient reports that it caused a twisting motion to his knee, causing him to fall forward.  Patient did not hit his head or lose consciousness.  Patient is complaining of pain to the left knee, left shin, left ankle.  Patient reports that he has had intermittent numbness and tingling in his toes but no loss of complete sensation.  Patient reports that he has abrasions to the left knee.  Patient has had no medications for this complaint prior to arrival.  No other injuries or complaints at this time.  Past Medical History:  Diagnosis Date  . Peptic ulcer     There are no active problems to display for this patient.   Past Surgical History:  Procedure Laterality Date  . APPENDECTOMY      Prior to Admission medications   Medication Sig Start Date End Date Taking? Authorizing Provider  HYDROcodone-acetaminophen (NORCO/VICODIN) 5-325 MG tablet Take 1 tablet by mouth every 4 (four) hours as needed for moderate pain. 08/30/17   Kyndall Amero, Delorise Royals, PA-C  predniSONE (DELTASONE) 10 MG tablet Take 1 tablet (10 mg total) by mouth daily. 08/30/17   Geneveive Furness, Delorise Royals, PA-C    Allergies Patient has no known allergies.  History reviewed. No pertinent family history.  Social History Social History   Tobacco Use  . Smoking status: Current Every Day Smoker  . Smokeless tobacco: Never Used  Substance Use Topics  . Alcohol use: Yes  . Drug use: Yes    Types:  Marijuana     Review of Systems  Constitutional: No fever/chills Eyes: No visual changes.  Cardiovascular: no chest pain. Respiratory: no cough. No SOB. Gastrointestinal: No abdominal pain.  No nausea, no vomiting.   Musculoskeletal: Positive for injury to the left lower leg, complaining of knee, shin, ankle pain. Skin: Negative for rash, abrasions, lacerations, ecchymosis. Neurological: Negative for headaches, focal weakness or numbness. 10-point ROS otherwise negative.  ____________________________________________   PHYSICAL EXAM:  VITAL SIGNS: ED Triage Vitals  Enc Vitals Group     BP 08/30/17 2051 126/86     Pulse Rate 08/30/17 2051 72     Resp 08/30/17 2051 18     Temp 08/30/17 2051 97.8 F (36.6 C)     Temp Source 08/30/17 2051 Oral     SpO2 08/30/17 2051 97 %     Weight 08/30/17 2052 165 lb (74.8 kg)     Height 08/30/17 2052 5\' 9"  (1.753 m)     Head Circumference --      Peak Flow --      Pain Score 08/30/17 2052 8     Pain Loc --      Pain Edu? --      Excl. in GC? --      Constitutional: Alert and oriented. Well appearing and in no acute distress. Eyes: Conjunctivae are normal. PERRL. EOMI. Head: Atraumatic. Neck: No stridor.    Cardiovascular: Normal rate, regular rhythm. Normal S1 and S2.  Good peripheral circulation. Respiratory: Normal respiratory effort without tachypnea or retractions. Lungs CTAB. Good air entry to the bases with no decreased or absent breath sounds. Musculoskeletal: Full range of motion to all extremities. No gross deformities appreciated.  No gross deformity, edema ecchymosis noted to the left lower extremity.  Patient does have a superficial abrasion over the tibial plateau.  Patient has limited range of motion in the knee and ankle joint due to pain.  Patient is tender to palpation along the anterior knee, specifically over the tibial plateau.  No palpable abnormality.  No deficits to the quadriceps tendon or patellar tendon.  Varus,  valgus, Lachman's, McMurray's is negative.  Patient is also tender palpation at the distal third of the tibia with no palpable abnormality.  No tenderness to palpation over the fibula.  Patient is nontender to palpation over bilateral malleolus is.  Dorsalis pedis pulse intact.  Sensation intact all 5 digits. Neurologic:  Normal speech and language. No gross focal neurologic deficits are appreciated.  Skin:  Skin is warm, dry and intact. No rash noted. Psychiatric: Mood and affect are normal. Speech and behavior are normal. Patient exhibits appropriate insight and judgement.   ____________________________________________   LABS (all labs ordered are listed, but only abnormal results are displayed)  Labs Reviewed - No data to display ____________________________________________  EKG   ____________________________________________  RADIOLOGY Festus BarrenI, Monroe Qin D Dylin Breeden, personally viewed and evaluated these images (plain radiographs) as part of my medical decision making, as well as reviewing the written report by the radiologist.  Dg Tibia/fibula Left  Result Date: 08/30/2017 CLINICAL DATA:  42 year old male with fall and trauma to the left lower extremity. EXAM: LEFT TIBIA AND FIBULA - 2 VIEW COMPARISON:  Left knee radiograph dated 06/22/2012 FINDINGS: There is no evidence of fracture or other focal bone lesions. Soft tissues are unremarkable. IMPRESSION: Negative. Electronically Signed   By: Elgie CollardArash  Radparvar M.D.   On: 08/30/2017 21:50   Dg Knee Complete 4 Views Left  Result Date: 08/30/2017 CLINICAL DATA:  42 year old male with left knee trauma. EXAM: LEFT KNEE - COMPLETE 4+ VIEW COMPARISON:  Left lower extremity radiograph dated 08/30/2017 FINDINGS: No evidence of fracture, dislocation, or joint effusion. No evidence of arthropathy or other focal bone abnormality. Soft tissues are unremarkable. IMPRESSION: Negative. Electronically Signed   By: Elgie CollardArash  Radparvar M.D.   On: 08/30/2017 21:52     ____________________________________________    PROCEDURES  Procedure(s) performed:    Procedures    Medications  oxyCODONE-acetaminophen (PERCOCET/ROXICET) 5-325 MG per tablet 1 tablet (1 tablet Oral Given 08/30/17 2214)     ____________________________________________   INITIAL IMPRESSION / ASSESSMENT AND PLAN / ED COURSE  Pertinent labs & imaging results that were available during my care of the patient were reviewed by me and considered in my medical decision making (see chart for details).  Review of the [place] CSRS was performed in accordance of the [facility] prior to dispensing any controlled drugs.     Patient's diagnosis is consistent with sprain of the left knee.  Patient presented after stepping into a hole, twisting his knee.  Initial differential included fracture versus dislocation versus ligament rupture versus sprain.  X-ray reveals no acute osseous abnormality.  Exam is reassuring with no indication of acute ligamentous rupture.  Patient's exam is most consistent with sprain of the left knee.  Knee immobilizer placed in the emergency department.  Patient is given crutches for ambulation.. Patient will be discharged home with prescriptions for prednisone and limited  pain medication for symptom relief.  Patient was originally prescribed meloxicam but has a history of peptic ulcer disease with intermittent flares.  As such, patient was switched to prednisone taper.. Patient is to follow up with orthopedics as needed or otherwise directed. Patient is given ED precautions to return to the ED for any worsening or new symptoms.     ____________________________________________  FINAL CLINICAL IMPRESSION(S) / ED DIAGNOSES  Final diagnoses:  Sprain of left knee, unspecified ligament, initial encounter      NEW MEDICATIONS STARTED DURING THIS VISIT:  ED Discharge Orders        Ordered    meloxicam (MOBIC) 15 MG tablet  Daily,   Status:  Discontinued     08/30/17  2320    HYDROcodone-acetaminophen (NORCO/VICODIN) 5-325 MG tablet  Every 4 hours PRN,   Status:  Discontinued     08/30/17 2320    HYDROcodone-acetaminophen (NORCO/VICODIN) 5-325 MG tablet  Every 4 hours PRN     08/30/17 2345    predniSONE (DELTASONE) 10 MG tablet  Daily    Comments:  Take 6 pills x 2 days, 5 pills x 2 days, 4 pills x 2 days, 3 pills x 2 days, 2 pills x 2 days, and 1 pill x 2 days   08/30/17 2345          This chart was dictated using voice recognition software/Dragon. Despite best efforts to proofread, errors can occur which can change the meaning. Any change was purely unintentional.    Racheal Patches, PA-C 08/30/17 2352    Minna Antis, MD 09/02/17 [phone]

## 2017-11-27 ENCOUNTER — Other Ambulatory Visit: Payer: Self-pay

## 2017-11-27 ENCOUNTER — Encounter
Admission: RE | Admit: 2017-11-27 | Discharge: 2017-11-27 | Disposition: A | Discharge status: Home / Self Care | Payer: Self-pay | Source: Ambulatory Visit | Attending: Orthopedic Surgery | Admitting: Orthopedic Surgery

## 2017-11-27 HISTORY — DX: Gastro-esophageal reflux disease without esophagitis: K21.9

## 2017-11-27 HISTORY — DX: Personal history of Methicillin resistant Staphylococcus aureus infection: Z86.14

## 2017-11-27 HISTORY — DX: Cardiac murmur, unspecified: R01.1

## 2017-11-27 HISTORY — DX: Low back pain: M54.5

## 2017-11-27 HISTORY — DX: Low back pain, unspecified: M54.50

## 2017-11-30 NOTE — Patient Instructions (Signed)
Your procedure is scheduled on: 12-04-17 MONDAY Report to Same Day Surgery 2nd floor medical mall [facility](Medical Mall Entrance-take elevator on left to 2nd floor.  Check in with surgery information desk.) To find out your arrival time please call [phone] between 1PM - 3PM on 12-01-17 FRIDAY  Remember: Instructions that are not followed completely may result in serious medical risk, up to and including death, or upon the discretion of your surgeon and anesthesiologist your surgery may need to be rescheduled.    _x___ 1. Do not eat food after midnight the night before your procedure. NO GUM OR CANDY AFTER MIDNIGHT.  You may drink clear liquids up to 2 hours before you are scheduled to arrive at the hospital for your procedure.  Do not drink clear liquids within 2 hours of your scheduled arrival to the hospital.  Clear liquids include  --Water or Apple juice without pulp  --Clear carbohydrate beverage such as ClearFast or Gatorade  --Black Coffee or Clear Tea (No milk, no creamers, do not add anything to the coffee or Tea    __x__ 2. No Alcohol for 24 hours before or after surgery.   __x__3. No Smoking or e-cigarettes for 24 prior to surgery.  Do not use any chewable tobacco products for at least 6 hour prior to surgery   ____  4. Bring all medications with you on the day of surgery if instructed.    __x__ 5. Notify your doctor if there is any change in your medical condition     (cold, fever, infections).    x___6. On the morning of surgery brush your teeth with toothpaste and water.  You may rinse your mouth with mouth wash if you wish.  Do not swallow any toothpaste or mouthwash.   Do not wear jewelry, make-up, hairpins, clips or nail polish.  Do not wear lotions, powders, or perfumes. You may wear deodorant.  Do not shave 48 hours prior to surgery. Men may shave face and neck.  Do not bring valuables to the hospital.    [facility] is not [facility] any belongings or  valuables.               Contacts, dentures or bridgework may not be worn into surgery.  Leave your suitcase in the car. After surgery it may be brought to your room.  For patients admitted to the hospital, discharge time is determined by your treatment team.  _  Patients discharged the day of surgery will not be allowed to drive home.  You will need someone to drive you home and stay with you the night of your procedure.    Please read over the following fact sheets that you were given:   [facility] Preparing for Surgery and or MRSA Information   _x___ TAKE THE FOLLOWING MEDICATION THE MORNING OF SURGERY. These include:  1. ZANTAC (RANITIDINE)  2.TAKE A ZANTAC THE NIGHT BEFORE YOUR SURGERY  3.  4.  5.  6.  ____Fleets enema or Magnesium Citrate as directed.   _x___ Use CHG Soap or sage wipes as directed on instruction sheet   ____ Use inhalers on the day of surgery and bring to hospital day of surgery  ____ Stop Metformin and Janumet 2 days prior to surgery.    ____ Take 1/2 of usual insulin dose the night before surgery and none on the morning surgery.   ____ Follow recommendations from Cardiologist, Pulmonologist or PCP regarding stopping Aspirin, Coumadin, Plavix ,Eliquis, Effient, or Pradaxa,  and Pletal.  X____Stop Anti-inflammatories such as Advil, Aleve, Ibuprofen, Motrin, Naproxen, Naprosyn, Goodies powders or aspirin products NOW-OK to take Tylenol    ____ Stop supplements until after surgery.     ____ Bring C-Pap to the hospital.

## 2017-12-01 ENCOUNTER — Inpatient Hospital Stay: Admission: RE | Admit: 2017-12-01 | Payer: Self-pay | Source: Ambulatory Visit

## 2017-12-01 ENCOUNTER — Ambulatory Visit: Payer: No Typology Code available for payment source | Admitting: Anesthesiology

## 2017-12-03 MED ORDER — CEFAZOLIN SODIUM-DEXTROSE 1-4 GM/50ML-% IV SOLN: 1.0000 g | Freq: Once | INTRAVENOUS | Status: DC

## 2017-12-04 ENCOUNTER — Other Ambulatory Visit: Payer: Self-pay

## 2017-12-04 ENCOUNTER — Ambulatory Visit
Admission: RE | Admit: 2017-12-04 | Discharge: 2017-12-04 | Disposition: A | Discharge status: Home / Self Care | Payer: No Typology Code available for payment source | Source: Ambulatory Visit | Attending: Orthopedic Surgery | Admitting: Orthopedic Surgery

## 2017-12-04 ENCOUNTER — Encounter
Admission: RE | Disposition: A | Discharge status: Home / Self Care | Payer: Self-pay | Source: Ambulatory Visit | Attending: Orthopedic Surgery

## 2017-12-04 DIAGNOSIS — X58XXXA Exposure to other specified factors, initial encounter: Secondary | ICD-10-CM | POA: Insufficient documentation

## 2017-12-04 DIAGNOSIS — Z5309 Procedure and treatment not carried out because of other contraindication: Secondary | ICD-10-CM | POA: Diagnosis not present

## 2017-12-04 DIAGNOSIS — S83242A Other tear of medial meniscus, current injury, left knee, initial encounter: Secondary | ICD-10-CM | POA: Insufficient documentation

## 2017-12-04 LAB — URINE DRUG SCREEN, QUALITATIVE (ARMC ONLY)
Amphetamines, Ur Screen: NOT DETECTED
Barbiturates, Ur Screen: NOT DETECTED
Benzodiazepine, Ur Scrn: NOT DETECTED
COCAINE METABOLITE, UR ~~LOC~~: POSITIVE — AB
Cannabinoid 50 Ng, Ur ~~LOC~~: POSITIVE — AB
MDMA (ECSTASY) UR SCREEN: NOT DETECTED
Methadone Scn, Ur: NOT DETECTED
OPIATE, UR SCREEN: NOT DETECTED
PHENCYCLIDINE (PCP) UR S: NOT DETECTED
Tricyclic, Ur Screen: NOT DETECTED

## 2017-12-04 LAB — SURGICAL PCR SCREEN
MRSA, PCR: NEGATIVE
STAPHYLOCOCCUS AUREUS: NEGATIVE

## 2017-12-04 SURGERY — ARTHROSCOPY, KNEE, WITH MEDIAL MENISCECTOMY
Anesthesia: General | Laterality: Left

## 2017-12-04 MED ORDER — FAMOTIDINE 20 MG PO TABS: 20.0000 mg | ORAL_TABLET | Freq: Once | ORAL | Status: DC

## 2017-12-04 MED ORDER — CEFAZOLIN SODIUM-DEXTROSE 2-3 GM-%(50ML) IV SOLR
INTRAVENOUS | Status: AC
  Filled 2017-12-04: qty 50

## 2017-12-04 MED ORDER — PROPOFOL 10 MG/ML IV BOLUS
INTRAVENOUS | Status: AC
  Filled 2017-12-04: qty 20

## 2017-12-04 MED ORDER — LIDOCAINE HCL (PF) 2 % IJ SOLN
INTRAMUSCULAR | Status: AC
  Filled 2017-12-04: qty 10

## 2017-12-04 MED ORDER — EPHEDRINE SULFATE 50 MG/ML IJ SOLN
INTRAMUSCULAR | Status: AC
  Filled 2017-12-04: qty 1

## 2017-12-04 MED ORDER — BUPIVACAINE HCL (PF) 0.5 % IJ SOLN
INTRAMUSCULAR | Status: AC
Start: 2017-12-04 — End: 2017-12-04
  Filled 2017-12-04: qty 30

## 2017-12-04 MED ORDER — MIDAZOLAM HCL 2 MG/2ML IJ SOLN
INTRAMUSCULAR | Status: AC
  Filled 2017-12-04: qty 2

## 2017-12-04 MED ORDER — CEFAZOLIN SODIUM-DEXTROSE 1-4 GM/50ML-% IV SOLN
INTRAVENOUS | Status: AC
  Filled 2017-12-04: qty 50

## 2017-12-04 MED ORDER — PHENYLEPHRINE HCL 10 MG/ML IJ SOLN
INTRAMUSCULAR | Status: AC
  Filled 2017-12-04: qty 1

## 2017-12-04 MED ORDER — LIDOCAINE-EPINEPHRINE 1 %-1:100000 IJ SOLN
INTRAMUSCULAR | Status: AC
  Filled 2017-12-04: qty 1

## 2017-12-04 MED ORDER — FAMOTIDINE 20 MG PO TABS
ORAL_TABLET | ORAL | Status: AC
  Administered 2017-12-04: 20 mg
  Filled 2017-12-04: qty 1

## 2017-12-04 MED ORDER — FENTANYL CITRATE (PF) 100 MCG/2ML IJ SOLN
INTRAMUSCULAR | Status: AC
  Filled 2017-12-04: qty 2

## 2017-12-04 MED ORDER — LACTATED RINGERS IV SOLN
INTRAVENOUS | Status: DC
  Administered 2017-12-04: 07:00:00 via INTRAVENOUS

## 2017-12-04 SURGICAL SUPPLY — 56 items
ADAPTER IRRIG TUBE 2 SPIKE SOL (ADAPTER) IMPLANT
BLADE SURG SZ10 CARB STEEL (BLADE) IMPLANT
BLADE SURG SZ11 CARB STEEL (BLADE) IMPLANT
BNDG ESMARK 6X12 TAN STRL LF (GAUZE/BANDAGES/DRESSINGS) IMPLANT
BRUSH SCRUB EZ  4% CHG (MISCELLANEOUS)
BRUSH SCRUB EZ 4% CHG (MISCELLANEOUS) IMPLANT
BUR RADIUS 3.5 (BURR) IMPLANT
BUR RADIUS 4.0X18.5 (BURR) IMPLANT
CHLORAPREP W/TINT 26ML (MISCELLANEOUS) IMPLANT
CLOSURE WOUND 1/2 X4 (GAUZE/BANDAGES/DRESSINGS)
COOLER POLAR GLACIER W/PUMP (MISCELLANEOUS) IMPLANT
CUFF TOURN 24 STER (MISCELLANEOUS) IMPLANT
CUFF TOURN 30 STER DUAL PORT (MISCELLANEOUS) IMPLANT
DRAPE IMP U-DRAPE 54X76 (DRAPES) IMPLANT
DRAPE LEGGINS SURG 28X43 STRL (DRAPES) IMPLANT
ELECT REM PT RETURN 9FT ADLT (ELECTROSURGICAL)
ELECTRODE REM PT RTRN 9FT ADLT (ELECTROSURGICAL) IMPLANT
GAUZE SPONGE 4X4 12PLY STRL (GAUZE/BANDAGES/DRESSINGS) IMPLANT
GLOVE BIOGEL PI IND STRL 8 (GLOVE) IMPLANT
GLOVE BIOGEL PI INDICATOR 8 (GLOVE)
GLOVE SURG SYN 7.5  E (GLOVE)
GLOVE SURG SYN 7.5 E (GLOVE) IMPLANT
GOWN STRL REUS W/ TWL LRG LVL3 (GOWN DISPOSABLE) IMPLANT
GOWN STRL REUS W/ TWL XL LVL3 (GOWN DISPOSABLE) IMPLANT
GOWN STRL REUS W/TWL LRG LVL3 (GOWN DISPOSABLE)
GOWN STRL REUS W/TWL XL LVL3 (GOWN DISPOSABLE)
IV LACTATED RINGER IRRG 3000ML (IV SOLUTION)
IV LR IRRIG 3000ML ARTHROMATIC (IV SOLUTION) IMPLANT
KIT TURNOVER KIT A (KITS) IMPLANT
MANIFOLD NEPTUNE II (INSTRUMENTS) IMPLANT
MAT BLUE FLOOR 46X72 FLO (MISCELLANEOUS) IMPLANT
NEEDLE HYPO 22GX1.5 SAFETY (NEEDLE) IMPLANT
NEEDLE MAYO 6 CRC TAPER PT (NEEDLE) IMPLANT
PACK ARTHROSCOPY KNEE (MISCELLANEOUS) IMPLANT
PAD ABD DERMACEA PRESS 5X9 (GAUZE/BANDAGES/DRESSINGS) IMPLANT
PAD WRAPON POLAR KNEE (MISCELLANEOUS) IMPLANT
PADDING CAST 6X4YD NS (MISCELLANEOUS)
PADDING CAST COTTON 6X4 NS (MISCELLANEOUS) IMPLANT
PENCIL ELECTRO HAND CTR (MISCELLANEOUS) IMPLANT
SET TUBE SUCT SHAVER OUTFL 24K (TUBING) IMPLANT
SET TUBE TIP INTRA-ARTICULAR (MISCELLANEOUS) IMPLANT
STRIP CLOSURE SKIN 1/2X4 (GAUZE/BANDAGES/DRESSINGS) IMPLANT
SUT ETHILON 3-0 FS-10 30 BLK (SUTURE)
SUT MNCRL 4-0 (SUTURE)
SUT MNCRL 4-0 27XMFL (SUTURE)
SUT VIC AB 0 SH 27 (SUTURE) IMPLANT
SUT VIC AB 2-0 SH 27 (SUTURE)
SUT VIC AB 2-0 SH 27XBRD (SUTURE) IMPLANT
SUTURE EHLN 3-0 FS-10 30 BLK (SUTURE) IMPLANT
SUTURE MNCRL 4-0 27XMF (SUTURE) IMPLANT
TAPE TRANSPORE STRL 2 31045 (GAUZE/BANDAGES/DRESSINGS) IMPLANT
TOWEL OR 17X26 4PK STRL BLUE (TOWEL DISPOSABLE) IMPLANT
TUBING ARTHRO INFLOW-ONLY STRL (TUBING) IMPLANT
WAND HAND CNTRL MULTIVAC 50 (MISCELLANEOUS) IMPLANT
WAND HAND CNTRL MULTIVAC 90 (MISCELLANEOUS) IMPLANT
WRAPON POLAR PAD KNEE (MISCELLANEOUS)

## 2017-12-04 NOTE — Anesthesia Preprocedure Evaluation (Deleted)
Anesthesia Evaluation  Patient identified by MRN, date of birth, ID band Patient awake    Reviewed: Allergy & Precautions, H&P , NPO status , Patient's Chart, lab work & pertinent test results, reviewed documented beta blocker date and time   History of Anesthesia Complications Negative for: history of anesthetic complications  Airway Mallampati: I  TM Distance: >3 FB Neck ROM: full    Dental  (+) Chipped, Dental Advidsory Given, Teeth Intact   Pulmonary neg shortness of breath, neg COPD, neg recent URI, Current Smoker,           Cardiovascular Exercise Tolerance: Good (-) hypertension(-) angina(-) CAD, (-) Past MI, (-) Cardiac Stents and (-) CABG (-) dysrhythmias + Valvular Problems/Murmurs      Neuro/Psych negative neurological ROS  negative psych ROS   GI/Hepatic Neg liver ROS, PUD, GERD  ,  Endo/Other  negative endocrine ROS  Renal/GU negative Renal ROS  negative genitourinary   Musculoskeletal   Abdominal   Peds  Hematology negative hematology ROS (+)   Anesthesia Other Findings Past Medical History: No date: GERD (gastroesophageal reflux disease) No date: Heart murmur     Comment:  ASYMPTOMATIC 2014: History of methicillin resistant staphylococcus aureus (MRSA) No date: Lower back pain     Comment:  SINCE FALL AT WORK No date: Peptic ulcer   Reproductive/Obstetrics negative OB ROS                             Anesthesia Physical Anesthesia Plan  ASA: II  Anesthesia Plan: General   Post-op Pain Management:    Induction: Intravenous  PONV Risk Score and Plan:   Airway Management Planned: LMA  Additional Equipment:   Intra-op Plan:   Post-operative Plan: Extubation in OR  Informed Consent: I have reviewed the patients History and Physical, chart, labs and discussed the procedure including the risks, benefits and alternatives for the proposed anesthesia with the  patient or authorized representative who has indicated his/her understanding and acceptance.   Dental Advisory Given  Plan Discussed with: Anesthesiologist, CRNA and Surgeon  Anesthesia Plan Comments:         Anesthesia Quick Evaluation

## 2017-12-04 NOTE — OR Nursing (Signed)
Patient has a positive  drug screen this am.  Anesthesia and Dr Allena KatzPatel notified.  Both in to talk to patient. Procedure canceled per anesthesia.

## 2017-12-10 MED ORDER — CEFAZOLIN SODIUM-DEXTROSE 1-4 GM/50ML-% IV SOLN
1.0000 g | Freq: Once | INTRAVENOUS | Status: AC
  Administered 2017-12-11: 1 g via INTRAVENOUS

## 2017-12-11 ENCOUNTER — Encounter
Admission: RE | Disposition: A | Discharge status: Home / Self Care | Payer: Self-pay | Source: Ambulatory Visit | Attending: Orthopedic Surgery

## 2017-12-11 ENCOUNTER — Ambulatory Visit
Admission: RE | Admit: 2017-12-11 | Discharge: 2017-12-11 | Disposition: A | Discharge status: Home / Self Care | Payer: No Typology Code available for payment source | Source: Ambulatory Visit | Attending: Orthopedic Surgery | Admitting: Orthopedic Surgery

## 2017-12-11 ENCOUNTER — Other Ambulatory Visit: Payer: Self-pay

## 2017-12-11 ENCOUNTER — Ambulatory Visit: Payer: No Typology Code available for payment source | Admitting: Anesthesiology

## 2017-12-11 ENCOUNTER — Encounter: Payer: Self-pay | Admitting: *Deleted

## 2017-12-11 DIAGNOSIS — X500XXA Overexertion from strenuous movement or load, initial encounter: Secondary | ICD-10-CM | POA: Insufficient documentation

## 2017-12-11 DIAGNOSIS — Y9289 Other specified places as the place of occurrence of the external cause: Secondary | ICD-10-CM | POA: Diagnosis not present

## 2017-12-11 DIAGNOSIS — F17200 Nicotine dependence, unspecified, uncomplicated: Secondary | ICD-10-CM | POA: Insufficient documentation

## 2017-12-11 DIAGNOSIS — S83212A Bucket-handle tear of medial meniscus, current injury, left knee, initial encounter: Secondary | ICD-10-CM | POA: Diagnosis not present

## 2017-12-11 DIAGNOSIS — Z8711 Personal history of peptic ulcer disease: Secondary | ICD-10-CM | POA: Diagnosis not present

## 2017-12-11 DIAGNOSIS — Y9301 Activity, walking, marching and hiking: Secondary | ICD-10-CM | POA: Diagnosis not present

## 2017-12-11 DIAGNOSIS — Z79899 Other long term (current) drug therapy: Secondary | ICD-10-CM | POA: Diagnosis not present

## 2017-12-11 HISTORY — PX: KNEE ARTHROSCOPY WITH MENISCAL REPAIR: SHX5653

## 2017-12-11 LAB — URINE DRUG SCREEN, QUALITATIVE (ARMC ONLY)
AMPHETAMINES, UR SCREEN: NOT DETECTED
Barbiturates, Ur Screen: NOT DETECTED
Benzodiazepine, Ur Scrn: NOT DETECTED
COCAINE METABOLITE, UR ~~LOC~~: NOT DETECTED
Cannabinoid 50 Ng, Ur ~~LOC~~: NOT DETECTED
MDMA (ECSTASY) UR SCREEN: NOT DETECTED
Methadone Scn, Ur: NOT DETECTED
OPIATE, UR SCREEN: NOT DETECTED
Phencyclidine (PCP) Ur S: NOT DETECTED
Tricyclic, Ur Screen: NOT DETECTED

## 2017-12-11 SURGERY — ARTHROSCOPY, KNEE, WITH MENISCUS REPAIR
Anesthesia: General | Laterality: Left

## 2017-12-11 MED ORDER — HYDROCODONE-ACETAMINOPHEN 5-325 MG PO TABS: 1.0000 | ORAL_TABLET | ORAL | 0 refills | Status: DC | PRN

## 2017-12-11 MED ORDER — CEFAZOLIN SODIUM-DEXTROSE 2-3 GM-%(50ML) IV SOLR
INTRAVENOUS | Status: AC
  Filled 2017-12-11: qty 50

## 2017-12-11 MED ORDER — LACTATED RINGERS IV SOLN
INTRAVENOUS | Status: DC
  Administered 2017-12-11: 07:00:00 via INTRAVENOUS

## 2017-12-11 MED ORDER — OXYCODONE HCL 5 MG/5ML PO SOLN: 5.0000 mg | Freq: Once | ORAL | Status: DC | PRN

## 2017-12-11 MED ORDER — MIDAZOLAM HCL 2 MG/2ML IJ SOLN
INTRAMUSCULAR | Status: DC | PRN
  Administered 2017-12-11: 2 mg via INTRAVENOUS

## 2017-12-11 MED ORDER — FENTANYL CITRATE (PF) 100 MCG/2ML IJ SOLN
INTRAMUSCULAR | Status: AC
  Filled 2017-12-11: qty 2

## 2017-12-11 MED ORDER — LIDOCAINE-EPINEPHRINE 1 %-1:100000 IJ SOLN
INTRAMUSCULAR | Status: DC | PRN
  Administered 2017-12-11: 2.5 mL
  Administered 2017-12-11: 5 mL

## 2017-12-11 MED ORDER — IBUPROFEN 800 MG PO TABS: 800.0000 mg | ORAL_TABLET | Freq: Three times a day (TID) | ORAL | 0 refills | Status: AC

## 2017-12-11 MED ORDER — PROPOFOL 10 MG/ML IV BOLUS
INTRAVENOUS | Status: DC | PRN
  Administered 2017-12-11: 200 mg via INTRAVENOUS

## 2017-12-11 MED ORDER — FAMOTIDINE 20 MG PO TABS: 20.0000 mg | ORAL_TABLET | Freq: Once | ORAL | Status: DC

## 2017-12-11 MED ORDER — ONDANSETRON 4 MG PO TBDP: 4.0000 mg | ORAL_TABLET | Freq: Three times a day (TID) | ORAL | 0 refills | Status: DC | PRN

## 2017-12-11 MED ORDER — CEFAZOLIN SODIUM-DEXTROSE 1-4 GM/50ML-% IV SOLN
INTRAVENOUS | Status: AC
  Filled 2017-12-11: qty 50

## 2017-12-11 MED ORDER — ONDANSETRON HCL 4 MG/2ML IJ SOLN
INTRAMUSCULAR | Status: DC | PRN
  Administered 2017-12-11: 4 mg via INTRAVENOUS

## 2017-12-11 MED ORDER — LIDOCAINE HCL (CARDIAC) PF 100 MG/5ML IV SOSY
PREFILLED_SYRINGE | INTRAVENOUS | Status: DC | PRN
  Administered 2017-12-11: 100 mg via INTRAVENOUS

## 2017-12-11 MED ORDER — MEPERIDINE HCL 50 MG/ML IJ SOLN: 6.2500 mg | INTRAMUSCULAR | Status: DC | PRN

## 2017-12-11 MED ORDER — BUPIVACAINE HCL (PF) 0.5 % IJ SOLN
INTRAMUSCULAR | Status: AC
  Filled 2017-12-11: qty 30

## 2017-12-11 MED ORDER — FENTANYL CITRATE (PF) 100 MCG/2ML IJ SOLN
INTRAMUSCULAR | Status: AC
  Administered 2017-12-11: 25 ug via INTRAVENOUS
  Filled 2017-12-11: qty 2

## 2017-12-11 MED ORDER — MIDAZOLAM HCL 2 MG/2ML IJ SOLN
INTRAMUSCULAR | Status: AC
  Filled 2017-12-11: qty 2

## 2017-12-11 MED ORDER — FENTANYL CITRATE (PF) 100 MCG/2ML IJ SOLN
25.0000 ug | INTRAMUSCULAR | Status: DC | PRN
  Administered 2017-12-11: 50 ug via INTRAVENOUS
  Administered 2017-12-11 (×4): 25 ug via INTRAVENOUS

## 2017-12-11 MED ORDER — PROPOFOL 10 MG/ML IV BOLUS
INTRAVENOUS | Status: AC
  Filled 2017-12-11: qty 20

## 2017-12-11 MED ORDER — FAMOTIDINE 20 MG PO TABS
ORAL_TABLET | ORAL | Status: AC
  Administered 2017-12-11: 20 mg
  Filled 2017-12-11: qty 1

## 2017-12-11 MED ORDER — OXYCODONE HCL 5 MG PO TABS: 5.0000 mg | ORAL_TABLET | Freq: Once | ORAL | Status: DC | PRN

## 2017-12-11 MED ORDER — ASPIRIN EC 325 MG PO TBEC: 325.0000 mg | DELAYED_RELEASE_TABLET | Freq: Every day | ORAL | 0 refills | Status: AC

## 2017-12-11 MED ORDER — BUPIVACAINE HCL (PF) 0.5 % IJ SOLN
INTRAMUSCULAR | Status: DC | PRN
  Administered 2017-12-11: 2.5 mL
  Administered 2017-12-11: 5 mL

## 2017-12-11 MED ORDER — PROMETHAZINE HCL 25 MG/ML IJ SOLN: 6.2500 mg | INTRAMUSCULAR | Status: DC | PRN

## 2017-12-11 MED ORDER — LIDOCAINE-EPINEPHRINE 1 %-1:100000 IJ SOLN
INTRAMUSCULAR | Status: AC
  Filled 2017-12-11: qty 1

## 2017-12-11 MED ORDER — HYDROCODONE-ACETAMINOPHEN 5-325 MG PO TABS
ORAL_TABLET | ORAL | Status: DC
Start: 2017-12-11 — End: 2017-12-11
  Filled 2017-12-11: qty 2

## 2017-12-11 MED ORDER — HYDROCODONE-ACETAMINOPHEN 5-325 MG PO TABS
1.0000 | ORAL_TABLET | ORAL | Status: DC | PRN
  Administered 2017-12-11: 2 via ORAL

## 2017-12-11 SURGICAL SUPPLY — 56 items
ADAPTER IRRIG TUBE 2 SPIKE SOL (ADAPTER) ×6 IMPLANT
BLADE SURG SZ10 CARB STEEL (BLADE) ×3 IMPLANT
BLADE SURG SZ11 CARB STEEL (BLADE) ×3 IMPLANT
BNDG ESMARK 6X12 TAN STRL LF (GAUZE/BANDAGES/DRESSINGS) ×3 IMPLANT
BRUSH SCRUB EZ  4% CHG (MISCELLANEOUS) ×2
BRUSH SCRUB EZ 4% CHG (MISCELLANEOUS) ×1 IMPLANT
BUR RADIUS 3.5 (BURR) ×3 IMPLANT
BUR RADIUS 4.0X18.5 (BURR) IMPLANT
CHLORAPREP W/TINT 26ML (MISCELLANEOUS) ×3 IMPLANT
CLOSURE WOUND 1/2 X4 (GAUZE/BANDAGES/DRESSINGS) ×1
COOLER POLAR GLACIER W/PUMP (MISCELLANEOUS) ×3 IMPLANT
CUFF TOURN 24 STER (MISCELLANEOUS) IMPLANT
CUFF TOURN 30 STER DUAL PORT (MISCELLANEOUS) ×3 IMPLANT
DRAPE IMP U-DRAPE 54X76 (DRAPES) ×3 IMPLANT
DRAPE LEGGINS SURG 28X43 STRL (DRAPES) IMPLANT
ELECT REM PT RETURN 9FT ADLT (ELECTROSURGICAL) ×3
ELECTRODE REM PT RTRN 9FT ADLT (ELECTROSURGICAL) ×1 IMPLANT
GAUZE SPONGE 4X4 12PLY STRL (GAUZE/BANDAGES/DRESSINGS) ×3 IMPLANT
GLOVE BIOGEL PI IND STRL 8 (GLOVE) ×1 IMPLANT
GLOVE BIOGEL PI INDICATOR 8 (GLOVE) ×2
GLOVE SURG SYN 7.5  E (GLOVE) ×2
GLOVE SURG SYN 7.5 E (GLOVE) ×1 IMPLANT
GOWN STRL REUS W/ TWL LRG LVL3 (GOWN DISPOSABLE) ×1 IMPLANT
GOWN STRL REUS W/ TWL XL LVL3 (GOWN DISPOSABLE) ×1 IMPLANT
GOWN STRL REUS W/TWL LRG LVL3 (GOWN DISPOSABLE) ×2
GOWN STRL REUS W/TWL XL LVL3 (GOWN DISPOSABLE) ×2
IV LACTATED RINGER IRRG 3000ML (IV SOLUTION) ×8
IV LR IRRIG 3000ML ARTHROMATIC (IV SOLUTION) ×4 IMPLANT
KIT TURNOVER KIT A (KITS) ×3 IMPLANT
MANIFOLD NEPTUNE II (INSTRUMENTS) ×3 IMPLANT
MAT BLUE FLOOR 46X72 FLO (MISCELLANEOUS) ×3 IMPLANT
NEEDLE HYPO 22GX1.5 SAFETY (NEEDLE) ×3 IMPLANT
NEEDLE MAYO 6 CRC TAPER PT (NEEDLE) IMPLANT
PACK ARTHROSCOPY KNEE (MISCELLANEOUS) ×3 IMPLANT
PAD ABD DERMACEA PRESS 5X9 (GAUZE/BANDAGES/DRESSINGS) ×6 IMPLANT
PAD WRAPON POLAR KNEE (MISCELLANEOUS) ×1 IMPLANT
PADDING CAST 6X4YD NS (MISCELLANEOUS) ×2
PADDING CAST COTTON 6X4 NS (MISCELLANEOUS) ×1 IMPLANT
PENCIL ELECTRO HAND CTR (MISCELLANEOUS) ×3 IMPLANT
SET TUBE SUCT SHAVER OUTFL 24K (TUBING) ×3 IMPLANT
SET TUBE TIP INTRA-ARTICULAR (MISCELLANEOUS) ×3 IMPLANT
STRIP CLOSURE SKIN 1/2X4 (GAUZE/BANDAGES/DRESSINGS) ×2 IMPLANT
SUT ETHILON 3-0 FS-10 30 BLK (SUTURE) ×3
SUT MNCRL 4-0 (SUTURE) ×2
SUT MNCRL 4-0 27XMFL (SUTURE) ×1
SUT VIC AB 0 SH 27 (SUTURE) ×3 IMPLANT
SUT VIC AB 2-0 SH 27 (SUTURE) ×2
SUT VIC AB 2-0 SH 27XBRD (SUTURE) ×1 IMPLANT
SUTURE EHLN 3-0 FS-10 30 BLK (SUTURE) ×1 IMPLANT
SUTURE MNCRL 4-0 27XMF (SUTURE) ×1 IMPLANT
TAPE TRANSPORE STRL 2 31045 (GAUZE/BANDAGES/DRESSINGS) ×3 IMPLANT
TOWEL OR 17X26 4PK STRL BLUE (TOWEL DISPOSABLE) ×6 IMPLANT
TUBING ARTHRO INFLOW-ONLY STRL (TUBING) ×3 IMPLANT
WAND HAND CNTRL MULTIVAC 50 (MISCELLANEOUS) IMPLANT
WAND HAND CNTRL MULTIVAC 90 (MISCELLANEOUS) ×3 IMPLANT
WRAPON POLAR PAD KNEE (MISCELLANEOUS) ×3

## 2017-12-11 NOTE — Discharge Instructions (Addendum)
Arthroscopic Knee Surgery - Partial Meniscectomy °  °Post-Op Instructions °  °1. Bracing or crutches: Crutches will be provided at the time of discharge from the surgery center if you do not already have them. °  °2. Ice: You may be provided with a device (Polar Care) that allows you to ice the affected area effectively. Otherwise you can ice manually.  °  °3. Driving:  Plan on not driving for at least two weeks. Please note that you are advised NOT to drive while taking narcotic pain medications as you may be impaired and unsafe to drive. °  °4. Activity: Ankle pumps several times an hour while awake to prevent blood clots. Weight bearing: as tolerated. Use crutches for as needed (usually ~1 week or less) until pain allows you to ambulate without a limp. Bending and straightening the knee is unlimited. Elevate knee above heart level as much as possible for one week. Avoid standing more than 5 minutes (consecutively) for the first week.  Avoid long distance travel for 2 weeks. °  °5. Medications:  °- You have been provided a prescription for narcotic pain medicine. After surgery, take 1-2 narcotic tablets every 4 hours if needed for severe pain.  °- A prescription for anti-nausea medication will be provided in case the narcotic medicine causes nausea - take 1 tablet every 6 hours only if nauseated.  °- Take ibuprofen 800 mg every 8 hours WITH food to reduce post-operative knee swelling. DO NOT STOP IBUPROFEN POST-OP UNTIL INSTRUCTED TO DO SO at first post-op office visit (10-14 days after surgery). However, please discontinue if you have any abdominal discomfort after taking this.  °- Take enteric coated aspirin 325 mg once daily for 2 weeks to prevent blood clots.  ° ° °6. Bandages: The physical therapist should change the bandages at the first post-op appointment. If needed, the dressing supplies have been provided to you. °  °7. Physical Therapy: 1-2 times per week for 6 weeks. Therapy typically starts on post  operative Day 3 or 4. You have been provided an order for physical therapy. The therapist will provide home exercises. °  °8. Work: May return to full work usually around 2 weeks after 1st post-operative visit. May do light duty/desk job in approximately 1-2 weeks when off of narcotics, pain is well-controlled, and swelling has decreased. Labor intensive jobs may require 4-6 weeks to return.  °  °  °9. Post-Op Appointments: °Your first post-op appointment will be with Dr. Patel in approximately 2 weeks time.  °  °If you find that they have not been scheduled please call the Orthopaedic Appointment front desk at [phone]. ° °AMBULATORY SURGERY  °DISCHARGE INSTRUCTIONS ° ° °1) The drugs that you were given will stay in your system until tomorrow so for the next 24 hours you should not: ° °A) Drive an automobile °B) Make any legal decisions °C) Drink any alcoholic beverage ° ° °2) You may resume regular meals tomorrow.  Today it is better to start with liquids and gradually work up to solid foods. ° °You may eat anything you prefer, but it is better to start with liquids, then soup and crackers, and gradually work up to solid foods. ° ° °3) Please notify your doctor immediately if you have any unusual bleeding, trouble breathing, redness and pain at the surgery site, drainage, fever, or pain not relieved by medication. ° ° ° °4) Additional Instructions: ° ° ° ° ° ° ° °Please contact your physician with any problems or Same Day Surgery at [phone], Monday through Friday 6   am to 4 pm, or [facility] number at [phone]. ° °

## 2017-12-11 NOTE — Anesthesia Post-op Follow-up Note (Signed)
Anesthesia QCDR form completed.        

## 2017-12-11 NOTE — Anesthesia Postprocedure Evaluation (Signed)
Anesthesia Post Note  Patient: Nicolas Tapia  Procedure(s) Performed: KNEE ARTHROSCOPY WITH MENISCAL REPAIR,BUCKET HANDLE,CHONDROPLASTY (Left )  Patient location during evaluation: PACU Anesthesia Type: General Level of consciousness: awake and alert and oriented Pain management: pain level controlled Vital Signs Assessment: post-procedure vital signs reviewed and stable Respiratory status: spontaneous breathing, nonlabored ventilation and respiratory function stable Cardiovascular status: blood pressure returned to baseline and stable Postop Assessment: no signs of nausea or vomiting Anesthetic complications: no     Last Vitals:  Vitals:   12/11/17 0953 12/11/17 1009  BP: 111/82   Pulse: 72 68  Resp: 14 16  Temp: 37.1 C 36.7 C  SpO2: 99% 100%    Last Pain:  Vitals:   12/11/17 1009  TempSrc: Temporal  PainSc: 7                  Willi Borowiak

## 2017-12-11 NOTE — Addendum Note (Signed)
Addendum  created 12/11/17 1043 by Jannet MantisPace, Lauren Modisette, CRNA   Charge Capture section accepted

## 2017-12-11 NOTE — H&P (Signed)
Paper H&P to be scanned into permanent record. H&P reviewed. No significant changes noted.  Of note, patient was cancelled last week on 12/04/17 due to cocaine use. Repeat urine drug screen is negative today.  I also discussed at length with the patient that a repair would be performed only if he had good quality meniscus that was deemed to have a good chance at healing. Otherwise, we will plan for partial meniscectomy.

## 2017-12-11 NOTE — Op Note (Signed)
Operative Note   SURGERY DATE: 12/11/2017  PRE-OP DIAGNOSIS:  1. Left medial meniscus tear  POST-OP DIAGNOSIS: 1. Left medial meniscus tear  PROCEDURES:  1. Left knee arthroscopy, partial medial meniscectomy 2. Chondroplasty of patellofemoral and medial compartments  SURGEON: Rosealee AlbeeSunny H. Tiombe Tomeo, MD  ANESTHESIA: Gen  ESTIMATED BLOOD LOSS:minimal  TOTAL IV FLUIDS: per anesthesia  INDICATION(S):  Nicolas Tapia is a 42 y.o. male with signs and symptoms as well as MRI finding of a bucket handle medial meniscus tear. Initial injury was on 08/30/17 at work when he stepped into a hole while walking up a ramp. After discussion of risks, benefits, and alternatives to surgery, the patient elected to proceed.  OPERATIVE FINDINGS:   Examination under anesthesia:A careful examination under anesthesia was performed. Passive range of motion was: Hyperextension: 2. Extension: 0. Flexion: 130. Lachman: normal. Pivot Shift: normal. Posterior drawer: normal. Varus stability in full extension: normal. Varus stability in 30 degrees of flexion: normal. Valgus stability in full extension: normal. Valgus stability in 30 degrees of flexion: normal.  Intra-operative findings:A thorough arthroscopic examination of the knee was performed. The findings are: 1. Suprapatellar pouch: Normal 2. Undersurface of median ridge: Normal 3. Medial patellar facet: Grade 1-2 degenerative changes 4. Lateral patellar facet: Normal 5. Trochlea: Normal 6. Lateral gutter/popliteus tendon: Normal 7. Hoffa's fat pad: Inflamed 8. Medial gutter/plica: Normal 9. ACL: Normal 10. PCL: Normal 11. Medial meniscus: Bucket handle tear involving the anterior horn, body, and posterior horn with tear ~6-357mm of meniscocapsular junction. Flipped fragment was in intercondylar notch. It was significantly deformed and bulbous. There was also a small horizontal cleavage tear of the posterior horn.  12. Medial compartment  cartilage: Grade 1 degenerative changes to MFC and tibial plateau 13. Lateral meniscus: Normal 14. Lateral compartment cartilage: Grade 1 degenerative changes to tibial plateau  OPERATIVE REPORT:   I identified Nicolas Tapia the pre-operative holding area. I marked theoperativeknee with my initials. I reviewed the risks and benefits of the proposed surgical intervention and the patient (and/or patient's guardian) wished to proceed. The patient was transferred to the operative suite and placed in the supine position with all bony prominences padded. Anesthesia was administered. Appropriate IV antibioticswere administered prior to incision. The extremity was then prepped and draped in standard fashion. A time out was performed confirming the correct extremity, correct patient, and correct procedure.  Arthroscopy portals were marked. Local anesthetic was injected to the planned portal sites. The anterolateral portalwasestablished with an 11blade.   The arthroscope was placed in the anterolateral portal and theninto the suprapatellar pouch. A diagnostic knee scope was completed with the above findings. The medial meniscus tear was identified.  Next the medial portal was established under needle localization. The MCL was pie-crusted to improve visualization of the posterior horn. The flipped fragment of the medial meniscus was reduced with a probe. Decision was made to proceed with a partial meniscectomy. The meniscal tear was debrided using an arthroscopic biter and an oscillating shaver until the meniscus had stable borders. A chondroplasty was performed of the medial compartment and patellofemoral compartment such that there were stable cartilage edges without any loose fragments of cartilage. Arthroscopic fluid was removed from the joint.  The portals were closed with 3-0 Nylon suture. Sterile dressings included Xeroform, 4x4s, Sof-Rol, and Bias wrap. A Polarcare was placed.  The  patient was then awakened and taken to the PACU hemodynamically stable without complication.   POSTOPERATIVE PLAN: The patient will be discharged home today once they meet  PACU criteria. Aspirin 325 mg daily was prescribed for 2 weeks for DVT prophylaxis. Physical therapy will start on POD#3-4.Weight-bearing as tolerated. They will follow up in 2 weeks per protocol.

## 2017-12-11 NOTE — Transfer of Care (Signed)
Immediate Anesthesia Transfer of Care Note  Patient: Nicolas HodgkinsLouie Southard  Procedure(s) Performed: KNEE ARTHROSCOPY WITH MENISCAL REPAIR,BUCKET HANDLE,CHONDROPLASTY (Left )  Patient Location: PACU  Anesthesia Type:General  Level of Consciousness: awake  Airway & Oxygen Therapy: Patient Spontanous Breathing  Post-op Assessment: Report given to RN  Post vital signs: stable  Last Vitals:  Vitals Value Taken Time  BP 136/86 12/11/2017  9:08 AM  Temp    Pulse 73 12/11/2017  9:11 AM  Resp 15 12/11/2017  9:11 AM  SpO2 100 % 12/11/2017  9:11 AM  Vitals shown include unvalidated device data.  Last Pain:  Vitals:   12/11/17 16100632  TempSrc: Oral  PainSc: 7      Past Medical History:  Diagnosis Date  . GERD (gastroesophageal reflux disease)   . Heart murmur    ASYMPTOMATIC  . History of methicillin resistant staphylococcus aureus (MRSA) 2014  . Lower back pain    SINCE FALL AT WORK  . Peptic ulcer    Past Surgical History:  Procedure Laterality Date  . APPENDECTOMY     Scheduled Meds: . famotidine  20 mg Oral Once   Continuous Infusions: . lactated ringers 50 mL/hr at 12/11/17 0647   PRN Meds:.fentaNYL (SUBLIMAZE) injection, meperidine (DEMEROL) injection, oxyCODONE **OR** oxyCODONE, promethazine     Complications: No apparent anesthesia complications

## 2017-12-11 NOTE — Anesthesia Preprocedure Evaluation (Addendum)
Anesthesia Evaluation  Patient identified by MRN, date of birth, ID band Patient awake    Reviewed: Allergy & Precautions, NPO status , Patient's Chart, lab work & pertinent test results  History of Anesthesia Complications Negative for: history of anesthetic complications  Airway Mallampati: II  TM Distance: >3 FB Neck ROM: Full    Dental no notable dental hx.    Pulmonary neg sleep apnea, neg COPD, former smoker,    breath sounds clear to auscultation- rhonchi (-) wheezing      Cardiovascular Exercise Tolerance: Good (-) hypertension(-) CAD, (-) Past MI, (-) Cardiac Stents and (-) CABG  Rhythm:Regular Rate:Normal - Systolic murmurs and - Diastolic murmurs    Neuro/Psych negative neurological ROS  negative psych ROS   GI/Hepatic Neg liver ROS, PUD, GERD  ,  Endo/Other  negative endocrine ROSneg diabetes  Renal/GU negative Renal ROS     Musculoskeletal negative musculoskeletal ROS (+)   Abdominal (+) - obese,   Peds  Hematology negative hematology ROS (+)   Anesthesia Other Findings Past Medical History: No date: GERD (gastroesophageal reflux disease) No date: Heart murmur     Comment:  ASYMPTOMATIC 2014: History of methicillin resistant staphylococcus aureus (MRSA) No date: Lower back pain     Comment:  SINCE FALL AT WORK No date: Peptic ulcer   Reproductive/Obstetrics                             Anesthesia Physical Anesthesia Plan  ASA: II  Anesthesia Plan: General   Post-op Pain Management:    Induction: Intravenous  PONV Risk Score and Plan: 1 and Ondansetron and Midazolam  Airway Management Planned: LMA  Additional Equipment:   Intra-op Plan:   Post-operative Plan:   Informed Consent: I have reviewed the patients History and Physical, chart, labs and discussed the procedure including the risks, benefits and alternatives for the proposed anesthesia with the patient  or authorized representative who has indicated his/her understanding and acceptance.   Dental advisory given  Plan Discussed with: CRNA and Anesthesiologist  Anesthesia Plan Comments:        Anesthesia Quick Evaluation

## 2017-12-12 ENCOUNTER — Encounter: Payer: Self-pay | Admitting: Orthopedic Surgery

## 2018-06-02 ENCOUNTER — Encounter: Payer: Self-pay | Admitting: Emergency Medicine

## 2018-06-02 ENCOUNTER — Emergency Department: Payer: Self-pay

## 2018-06-02 ENCOUNTER — Other Ambulatory Visit: Payer: Self-pay

## 2018-06-02 ENCOUNTER — Emergency Department
Admission: EM | Admit: 2018-06-02 | Discharge: 2018-06-02 | Disposition: A | Discharge status: Home / Self Care | Payer: Self-pay | Attending: Emergency Medicine | Admitting: Emergency Medicine

## 2018-06-02 DIAGNOSIS — R110 Nausea: Secondary | ICD-10-CM | POA: Insufficient documentation

## 2018-06-02 DIAGNOSIS — F17210 Nicotine dependence, cigarettes, uncomplicated: Secondary | ICD-10-CM | POA: Insufficient documentation

## 2018-06-02 DIAGNOSIS — Z79899 Other long term (current) drug therapy: Secondary | ICD-10-CM | POA: Insufficient documentation

## 2018-06-02 DIAGNOSIS — Z8679 Personal history of other diseases of the circulatory system: Secondary | ICD-10-CM | POA: Insufficient documentation

## 2018-06-02 DIAGNOSIS — R079 Chest pain, unspecified: Secondary | ICD-10-CM | POA: Insufficient documentation

## 2018-06-02 DIAGNOSIS — F1490 Cocaine use, unspecified, uncomplicated: Secondary | ICD-10-CM | POA: Insufficient documentation

## 2018-06-02 LAB — CBC
HCT: 47.8 % (ref 39.0–52.0)
HEMOGLOBIN: 16.4 g/dL (ref 13.0–17.0)
MCH: 32.3 pg (ref 26.0–34.0)
MCHC: 34.3 g/dL (ref 30.0–36.0)
MCV: 94.3 fL (ref 80.0–100.0)
PLATELETS: 324 10*3/uL (ref 150–400)
RBC: 5.07 MIL/uL (ref 4.22–5.81)
RDW: 12.6 % (ref 11.5–15.5)
WBC: 14.4 10*3/uL — AB (ref 4.0–10.5)
nRBC: 0 % (ref 0.0–0.2)

## 2018-06-02 LAB — COMPREHENSIVE METABOLIC PANEL
ALT: 31 U/L (ref 0–44)
AST: 21 U/L (ref 15–41)
Albumin: 4.4 g/dL (ref 3.5–5.0)
Alkaline Phosphatase: 45 U/L (ref 38–126)
Anion gap: 9 (ref 5–15)
BUN: 10 mg/dL (ref 6–20)
CALCIUM: 9.5 mg/dL (ref 8.9–10.3)
CHLORIDE: 104 mmol/L (ref 98–111)
CO2: 26 mmol/L (ref 22–32)
CREATININE: 0.91 mg/dL (ref 0.61–1.24)
Glucose, Bld: 106 mg/dL — ABNORMAL HIGH (ref 70–99)
POTASSIUM: 4 mmol/L (ref 3.5–5.1)
Sodium: 139 mmol/L (ref 135–145)
TOTAL PROTEIN: 8 g/dL (ref 6.5–8.1)
Total Bilirubin: 1.7 mg/dL — ABNORMAL HIGH (ref 0.3–1.2)

## 2018-06-02 LAB — TROPONIN I

## 2018-06-02 MED ORDER — ACETAMINOPHEN 500 MG PO TABS
1000.0000 mg | ORAL_TABLET | Freq: Once | ORAL | Status: AC
  Administered 2018-06-02: 1000 mg via ORAL

## 2018-06-02 MED ORDER — SODIUM CHLORIDE 0.9 % IV BOLUS
1000.0000 mL | Freq: Once | INTRAVENOUS | Status: AC
  Administered 2018-06-02: 1000 mL via INTRAVENOUS

## 2018-06-02 MED ORDER — NITROGLYCERIN 0.4 MG SL SUBL
0.4000 mg | SUBLINGUAL_TABLET | SUBLINGUAL | Status: DC | PRN
  Administered 2018-06-02: 0.4 mg via SUBLINGUAL
  Filled 2018-06-02 (×2): qty 1

## 2018-06-02 MED ORDER — GI COCKTAIL ~~LOC~~
30.0000 mL | Freq: Once | ORAL | Status: AC
  Administered 2018-06-02: 30 mL via ORAL
  Filled 2018-06-02: qty 30

## 2018-06-02 MED ORDER — ONDANSETRON 4 MG PO TBDP
4.0000 mg | ORAL_TABLET | Freq: Once | ORAL | Status: AC
  Administered 2018-06-02: 4 mg via ORAL
  Filled 2018-06-02: qty 1

## 2018-06-02 MED ORDER — ACETAMINOPHEN 500 MG PO TABS
ORAL_TABLET | ORAL | Status: AC
  Administered 2018-06-02: 1000 mg via ORAL
  Filled 2018-06-02: qty 2

## 2018-06-02 NOTE — ED Notes (Signed)
Pt reports that GI cocktail did help relieve nausea but he is still having pressure in his chest when he sits up.

## 2018-06-02 NOTE — Discharge Instructions (Signed)
Please seek medical attention for any high fevers, chest pain, shortness of breath, change in behavior, persistent vomiting, bloody stool or any other new or concerning symptoms.  

## 2018-06-02 NOTE — ED Notes (Signed)
Called pt friend per his request, no answer.

## 2018-06-02 NOTE — ED Triage Notes (Signed)
L chest pain began at midnight, sharp L. States last cocaine use yesterday evening. Constantly sore but has intermittent sharp stabbing pains.

## 2018-06-02 NOTE — ED Notes (Signed)
PT in NAD at time of departure, VSS, ambulatory. PT waiting on ride in lobby

## 2018-06-02 NOTE — ED Notes (Signed)
Assisted pt to restroom  

## 2018-06-02 NOTE — ED Notes (Signed)
This RN informed Dr. Derrill Kay that pt states that nitro did help with his chest pain however he now has severe headache. RN informed pt this was from the nitro and that I would ask MD for medication for headache. Verbal order given for Tylenol 1,000 mg PO.

## 2018-06-02 NOTE — ED Notes (Signed)
Pt given meal tray and drink.

## 2018-06-02 NOTE — ED Provider Notes (Signed)
[facility] Emergency Department Provider Note   ____________________________________________   I have reviewed the triage vital signs and the nursing notes.   HISTORY  Chief Complaint Chest Pain   History limited by: Not Limited   HPI Nicolas Tapia is a 42 y.o. male who presents to the emergency department today with concerns for chest pain, nausea.  He states pain is located in his left chest.  It started at roughly midnight.  Patient states that he had been drinking and did some cocaine.  He had about 2 40s.  The pain is sharp.  Has been constant since midnight.  It is accompanied by nausea.  Patient also states that he is quite anxious.  He is concerned that he might have cancer.  Apparently he lost a friend of his recently to cancer.   Per medical record review patient has a history of GERD  Past Medical History:  Diagnosis Date  . GERD (gastroesophageal reflux disease)   . Heart murmur    ASYMPTOMATIC  . History of methicillin resistant staphylococcus aureus (MRSA) 2014  . Lower back pain    SINCE FALL AT WORK  . Peptic ulcer     There are no active problems to display for this patient.   Past Surgical History:  Procedure Laterality Date  . APPENDECTOMY    . KNEE ARTHROSCOPY WITH MENISCAL REPAIR Left 12/11/2017   Procedure: KNEE ARTHROSCOPY WITH PARTIAL MEDIAL MENISECTOMY ,CHONDROPLASTY;  Surgeon: Signa Kell, MD;  Location: [facility] ORS;  Service: Orthopedics;  Laterality: Left;    Prior to Admission medications   Medication Sig Start Date End Date Taking? Authorizing Provider  HYDROcodone-acetaminophen (NORCO) 5-325 MG tablet Take 1-2 tablets by mouth every 4 (four) hours as needed for moderate pain or severe pain. 12/11/17   Signa Kell, MD  ondansetron (ZOFRAN ODT) 4 MG disintegrating tablet Take 1 tablet (4 mg total) by mouth every 8 (eight) hours as needed for nausea or vomiting. 12/11/17   Signa Kell, MD  predniSONE (DELTASONE) 10 MG  tablet Take 1 tablet (10 mg total) by mouth daily. Patient not taking: Reported on 11/27/2017 08/30/17   Cuthriell, Delorise Royals, PA-C  ranitidine (ZANTAC) 150 MG tablet Take 150 mg by mouth as needed for heartburn.    [provider]    Allergies Patient has no known allergies.  No family history on file.  Social History Social History   Tobacco Use  . Smoking status: Current Every Day Smoker    Packs/day: 1.00    Years: 20.00    Pack years: 20.00    Types: Cigarettes  . Smokeless tobacco: Never Used  Substance Use Topics  . Alcohol use: Not Currently    Frequency: Never  . Drug use: Not Currently    Types: Marijuana    Comment: PT DENIES DURING PHONE INTERVIEW ON 11-27-17 BUT THIS WAS PREVIOUSLY IN EPIC    Review of Systems Constitutional: No fever/chills Eyes: No visual changes. ENT: No sore throat. Cardiovascular: Positive for left-sided chest pain Respiratory: Denies shortness of breath. Gastrointestinal: No abdominal pain.  Positive for nausea Genitourinary: Negative for dysuria. Musculoskeletal: Negative for back pain. Skin: Negative for rash. Neurological: Negative for headaches, focal weakness or numbness.  ____________________________________________   PHYSICAL EXAM:  VITAL SIGNS: ED Triage Vitals  Enc Vitals Group     BP 06/02/18 0803 113/75     Pulse Rate 06/02/18 0803 72     Resp 06/02/18 0803 (!) 2     Temp 06/02/18  0803 98.3 F (36.8 C)     Temp Source 06/02/18 0803 Oral     SpO2 06/02/18 0803 98 %     Weight 06/02/18 0804 160 lb (72.6 kg)     Height 06/02/18 0804 5\' 9"  (1.753 m)     Head Circumference --      Peak Flow --      Pain Score 06/02/18 0804 6   Constitutional: Alert and oriented.  Eyes: Conjunctivae are normal.  ENT      Head: Normocephalic and atraumatic.      Nose: No congestion/rhinnorhea.      Mouth/Throat: Mucous membranes are moist.      Neck: No stridor. Hematological/Lymphatic/Immunilogical: No cervical  lymphadenopathy. Cardiovascular: Normal rate, regular rhythm.  No murmurs, rubs, or gallops.  Respiratory: Normal respiratory effort without tachypnea nor retractions. Breath sounds are clear and equal bilaterally. No wheezes/rales/rhonchi. Gastrointestinal: Soft and non tender. No rebound. No guarding.  Genitourinary: Deferred Musculoskeletal: Normal range of motion in all extremities. No lower extremity edema. Neurologic:  Normal speech and language. No gross focal neurologic deficits are appreciated.  Skin:  Skin is warm, dry and intact. No rash noted. Psychiatric: Anxious, tearful  ____________________________________________    LABS (pertinent positives/negatives)  Trop <0.03 x 2 CBC wbc 14.4, hgb 16.4, plt 324 CMP wnl except glu 106, t bili 1.7 ____________________________________________   EKG  I, Phineas Semen, attending physician, personally viewed and interpreted this EKG  EKG Time: 0800 Rate: 65 Rhythm: normal sinus rhythm Axis: normal Intervals: qtc 413 QRS: narrow ST changes: no st elevation Impression: right atrial enlargement   ____________________________________________    RADIOLOGY  CXR No acute abnormality  ____________________________________________   PROCEDURES  Procedures  ____________________________________________   INITIAL IMPRESSION / ASSESSMENT AND PLAN / ED COURSE  Pertinent labs & imaging results that were available during my care of the patient were reviewed by me and considered in my medical decision making (see chart for details).   Presents to the emergency department today because of concerns for chest pain.  Patient's troponin negative x2.  Chest x-ray without any acute abnormality.  Patient does have a history of alcohol use and cocaine use.  Do wonder if the patient's symptoms are secondary to those drug use.  Patient did feel better after fluids.  Will plan on discharge.   __________________   FINAL CLINICAL  IMPRESSION(S) / ED DIAGNOSES  Final diagnoses:  Nonspecific chest pain  Cocaine use     Note: This dictation was prepared with Dragon dictation. Any transcriptional errors that result from this process are unintentional     Phineas Semen, MD 06/03/18 1151

## 2020-10-26 ENCOUNTER — Encounter: Payer: Self-pay | Admitting: Emergency Medicine

## 2020-10-26 ENCOUNTER — Emergency Department
Admission: EM | Admit: 2020-10-26 | Discharge: 2020-10-26 | Disposition: A | Discharge status: Home / Self Care | Payer: Self-pay | Attending: Emergency Medicine | Admitting: Emergency Medicine

## 2020-10-26 ENCOUNTER — Other Ambulatory Visit: Payer: Self-pay

## 2020-10-26 DIAGNOSIS — R52 Pain, unspecified: Secondary | ICD-10-CM

## 2020-10-26 DIAGNOSIS — M791 Myalgia, unspecified site: Secondary | ICD-10-CM | POA: Insufficient documentation

## 2020-10-26 DIAGNOSIS — Z20822 Contact with and (suspected) exposure to covid-19: Secondary | ICD-10-CM | POA: Insufficient documentation

## 2020-10-26 DIAGNOSIS — F17210 Nicotine dependence, cigarettes, uncomplicated: Secondary | ICD-10-CM | POA: Insufficient documentation

## 2020-10-26 LAB — CBC WITH DIFFERENTIAL/PLATELET
Abs Immature Granulocytes: 0.03 10*3/uL (ref 0.00–0.07)
Basophils Absolute: 0.1 10*3/uL (ref 0.0–0.1)
Basophils Relative: 1 %
Eosinophils Absolute: 0.5 10*3/uL (ref 0.0–0.5)
Eosinophils Relative: 5 %
HCT: 41.1 % (ref 39.0–52.0)
Hemoglobin: 14 g/dL (ref 13.0–17.0)
Immature Granulocytes: 0 %
Lymphocytes Relative: 40 %
Lymphs Abs: 3.5 10*3/uL (ref 0.7–4.0)
MCH: 32.7 pg (ref 26.0–34.0)
MCHC: 34.1 g/dL (ref 30.0–36.0)
MCV: 96 fL (ref 80.0–100.0)
Monocytes Absolute: 0.8 10*3/uL (ref 0.1–1.0)
Monocytes Relative: 9 %
Neutro Abs: 4 10*3/uL (ref 1.7–7.7)
Neutrophils Relative %: 45 %
Platelets: 326 10*3/uL (ref 150–400)
RBC: 4.28 MIL/uL (ref 4.22–5.81)
RDW: 12.7 % (ref 11.5–15.5)
WBC: 8.8 10*3/uL (ref 4.0–10.5)
nRBC: 0 % (ref 0.0–0.2)

## 2020-10-26 LAB — COMPREHENSIVE METABOLIC PANEL
ALT: 15 U/L (ref 0–44)
AST: 16 U/L (ref 15–41)
Albumin: 4 g/dL (ref 3.5–5.0)
Alkaline Phosphatase: 39 U/L (ref 38–126)
Anion gap: 6 (ref 5–15)
BUN: 15 mg/dL (ref 6–20)
CO2: 27 mmol/L (ref 22–32)
Calcium: 8.8 mg/dL — ABNORMAL LOW (ref 8.9–10.3)
Chloride: 105 mmol/L (ref 98–111)
Creatinine, Ser: 1 mg/dL (ref 0.61–1.24)
GFR, Estimated: >60 mL/min (ref >60)
Glucose, Bld: 98 mg/dL (ref 70–99)
Potassium: 4.2 mmol/L (ref 3.5–5.1)
Sodium: 138 mmol/L (ref 135–145)
Total Bilirubin: 0.6 mg/dL (ref 0.3–1.2)
Total Protein: 7 g/dL (ref 6.5–8.1)

## 2020-10-26 LAB — CK: Total CK: 138 U/L (ref 49–397)

## 2020-10-26 MED ORDER — SODIUM CHLORIDE 0.9 % IV BOLUS
1000.0000 mL | Freq: Once | INTRAVENOUS | Status: AC
  Administered 2020-10-26: 1000 mL via INTRAVENOUS

## 2020-10-26 NOTE — ED Triage Notes (Signed)
C/O bodyaches since after work on Thursday. Patient states "I know its the job that is doing this.  I just started working at [employer] and its just too much for me."  AAOx3.  Skin warm and dry. Ambulates with easy and steady gait. NAD

## 2020-10-26 NOTE — ED Provider Notes (Signed)
[facility] Emergency Department Provider Note  ____________________________________________   Event Date/Time   First MD Initiated Contact with Patient 10/26/20 1829     (approximate)  I have reviewed the triage vital signs and the nursing notes.   HISTORY  Chief Complaint Generalized Body Aches    HPI Nicolas Tapia is a 45 y.o. male presents emergency department stating that he just cannot work in [employer].  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  States he has body aches since been in the bed since 24 February.  He has no fever or cough.  No known injuries.  States his muscles hurt especially in his thighs.  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  Patient is not vaccinated for Covid    Past Medical History:  Diagnosis Date  . GERD (gastroesophageal reflux disease)   . Heart murmur    ASYMPTOMATIC  . History of methicillin resistant staphylococcus aureus (MRSA) 2014  . Lower back pain    SINCE FALL AT WORK  . Peptic ulcer     There are no problems to display for this patient.   Past Surgical History:  Procedure Laterality Date  . APPENDECTOMY    . KNEE ARTHROSCOPY WITH MENISCAL REPAIR Left 12/11/2017   Procedure: KNEE ARTHROSCOPY WITH PARTIAL MEDIAL MENISECTOMY ,CHONDROPLASTY;  Surgeon: Signa Kell, MD;  Location: [facility] ORS;  Service: Orthopedics;  Laterality: Left;    Prior to Admission medications   Medication Sig Start Date End Date Taking? Authorizing Provider  ranitidine (ZANTAC) 150 MG tablet Take 150 mg by mouth as needed for heartburn.    [provider]    Allergies Patient has no known allergies.  No family history on file.  Social History Social History   Tobacco Use  . Smoking status: Current Every Day Smoker    Packs/day: 1.00    Years: 20.00    Pack years: 20.00    Types: Cigarettes  . Smokeless tobacco:  Never Used  Vaping Use  . Vaping Use: Every day  Substance Use Topics  . Alcohol use: Not Currently  . Drug use: Not Currently    Types: Marijuana    Comment: PT DENIES DURING PHONE INTERVIEW ON 11-27-17 BUT THIS WAS PREVIOUSLY IN EPIC    Review of Systems  Constitutional: No fever/chills Eyes: No visual changes. ENT: No sore throat. Respiratory: Denies cough Cardiovascular: Denies chest pain Gastrointestinal: Denies abdominal pain Genitourinary: Negative for dysuria. Musculoskeletal: Negative for back pain.  Positive for muscle pain Skin: Negative for rash. Psychiatric: no mood changes,     ____________________________________________   PHYSICAL EXAM:  VITAL SIGNS: ED Triage Vitals  Enc Vitals Group     BP 10/26/20 1739 116/74     Pulse Rate 10/26/20 1739 68     Resp 10/26/20 1739 16     Temp 10/26/20 1739 98.4 F (36.9 C)     Temp Source 10/26/20 1739 Oral     SpO2 10/26/20 1739 98 %     Weight 10/26/20 1738 160 lb 0.9 oz (72.6 kg)     Height 10/26/20 1738 5\' 9"  (1.753 m)     Head Circumference --      Peak Flow --      Pain Score 10/26/20 1738 10     Pain Loc --      Pain Edu? --      Excl.  in GC? --     Constitutional: Alert and oriented. Well appearing and in no acute distress. Eyes: Conjunctivae are normal.  Head: Atraumatic. Nose: No congestion/rhinnorhea. Mouth/Throat: Mucous membranes are moist.   Neck:  supple no lymphadenopathy noted Cardiovascular: Normal rate, regular rhythm. Heart sounds are normal Respiratory: Normal respiratory effort.  No retractions, lungs c t a  Abd: soft nontender bs normal all 4 quad GU: deferred Musculoskeletal: FROM all extremities, warm and well perfused, patient walks slowly Neurologic:  Normal speech and language.  Skin:  Skin is warm, dry and intact. No rash noted. Psychiatric: Mood and affect are normal. Speech and behavior are normal.  ____________________________________________   LABS (all labs ordered  are listed, but only abnormal results are displayed)  Labs Reviewed  COMPREHENSIVE METABOLIC PANEL - Abnormal; Notable for the following components:      Result Value   Calcium 8.8 (*)    All other components within normal limits  SARS CORONAVIRUS 2 (TAT 6-24 HRS)  CBC WITH DIFFERENTIAL/PLATELET  CK   ____________________________________________   ____________________________________________  RADIOLOGY    ____________________________________________   PROCEDURES  Procedure(s) performed: No  Procedures    ____________________________________________   INITIAL IMPRESSION / ASSESSMENT AND PLAN / ED COURSE  Pertinent labs & imaging results that were available during my care of the patient were reviewed by me and considered in my medical decision making (see chart for details).   Patient is 45 year old male presents emergency department body aches.  See HPI.  Physical exam shows the patient appears stable  DDx: Muscle strain, rhabdomyolysis, potassium depletion, Covid   Labs are reassuring, CBC, metabolic panel, and CK are normal.  Feel that the patient has mostly muscle strain as he probably has not worked in a while and started working at [employer].  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  Would recommend he be given a different position but it is up to their discretion.  He was discharged stable condition.  Nicolas Tapia was evaluated in Emergency Department on 10/26/2020 for the symptoms described in the history of present illness. He was evaluated in the context of the global COVID-19 pandemic, which necessitated consideration that the patient might be at risk for infection with the SARS-CoV-2 virus that causes COVID-19. Institutional protocols and algorithms that pertain to the evaluation of patients at risk for COVID-19 are in a state of rapid change based on information released by regulatory  bodies including the CDC and federal and state organizations. These policies and algorithms were followed during the patient's care in the ED.    As part of my medical decision making, I reviewed the following data within the electronic MEDICAL RECORD NUMBER Nursing notes reviewed and incorporated, Labs reviewed , Old chart reviewed, Notes from prior ED visits and [place] Controlled Substance Database  ____________________________________________   FINAL CLINICAL IMPRESSION(S) / ED DIAGNOSES  Final diagnoses:  Generalized body aches      NEW MEDICATIONS STARTED DURING THIS VISIT:  New Prescriptions   No medications on file     Note:  This document was prepared using Dragon voice recognition software and may include unintentional dictation errors.    Faythe Ghee, PA-C 10/26/20 1930    Gilles Chiquito, MD 10/26/20 [phone]

## 2020-10-26 NOTE — ED Notes (Signed)
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

## 2020-10-26 NOTE — ED Notes (Signed)
Provided discharge instructions. Verbalized understanding.  

## 2020-10-26 NOTE — Discharge Instructions (Signed)
Follow-up with your regular doctor.  All of your labs were normal today.  There is no indication that you have any muscle breakdown or electrolyte imbalance to create the muscle pain.  Covid test is pending and should result overnight or in the morning.  They will call you if it is positive

## 2020-10-27 LAB — SARS CORONAVIRUS 2 (TAT 6-24 HRS): SARS Coronavirus 2: NEGATIVE

## 2020-12-22 ENCOUNTER — Emergency Department
Admission: EM | Admit: 2020-12-22 | Discharge: 2020-12-22 | Disposition: A | Discharge status: Home / Self Care | Payer: Self-pay | Attending: Emergency Medicine | Admitting: Emergency Medicine

## 2020-12-22 ENCOUNTER — Emergency Department: Payer: Self-pay

## 2020-12-22 ENCOUNTER — Encounter: Payer: Self-pay | Admitting: Emergency Medicine

## 2020-12-22 DIAGNOSIS — S60417A Abrasion of left little finger, initial encounter: Secondary | ICD-10-CM | POA: Insufficient documentation

## 2020-12-22 DIAGNOSIS — F10920 Alcohol use, unspecified with intoxication, uncomplicated: Secondary | ICD-10-CM

## 2020-12-22 DIAGNOSIS — S60415A Abrasion of left ring finger, initial encounter: Secondary | ICD-10-CM | POA: Insufficient documentation

## 2020-12-22 DIAGNOSIS — R079 Chest pain, unspecified: Secondary | ICD-10-CM

## 2020-12-22 DIAGNOSIS — F17210 Nicotine dependence, cigarettes, uncomplicated: Secondary | ICD-10-CM | POA: Insufficient documentation

## 2020-12-22 DIAGNOSIS — R072 Precordial pain: Secondary | ICD-10-CM | POA: Insufficient documentation

## 2020-12-22 DIAGNOSIS — F1290 Cannabis use, unspecified, uncomplicated: Secondary | ICD-10-CM | POA: Insufficient documentation

## 2020-12-22 DIAGNOSIS — X58XXXA Exposure to other specified factors, initial encounter: Secondary | ICD-10-CM | POA: Insufficient documentation

## 2020-12-22 DIAGNOSIS — F1490 Cocaine use, unspecified, uncomplicated: Secondary | ICD-10-CM

## 2020-12-22 DIAGNOSIS — R0602 Shortness of breath: Secondary | ICD-10-CM | POA: Insufficient documentation

## 2020-12-22 DIAGNOSIS — F10129 Alcohol abuse with intoxication, unspecified: Secondary | ICD-10-CM | POA: Insufficient documentation

## 2020-12-22 LAB — CBC WITH DIFFERENTIAL/PLATELET
Abs Immature Granulocytes: 0.08 10*3/uL — ABNORMAL HIGH (ref 0.00–0.07)
Basophils Absolute: 0.1 10*3/uL (ref 0.0–0.1)
Basophils Relative: 1 %
Eosinophils Absolute: 0.3 10*3/uL (ref 0.0–0.5)
Eosinophils Relative: 2 %
HCT: 45.5 % (ref 39.0–52.0)
Hemoglobin: 15.5 g/dL (ref 13.0–17.0)
Immature Granulocytes: 1 %
Lymphocytes Relative: 18 %
Lymphs Abs: 2.5 10*3/uL (ref 0.7–4.0)
MCH: 31.8 pg (ref 26.0–34.0)
MCHC: 34.1 g/dL (ref 30.0–36.0)
MCV: 93.2 fL (ref 80.0–100.0)
Monocytes Absolute: 1.1 10*3/uL — ABNORMAL HIGH (ref 0.1–1.0)
Monocytes Relative: 8 %
Neutro Abs: 10.1 10*3/uL — ABNORMAL HIGH (ref 1.7–7.7)
Neutrophils Relative %: 70 %
Platelets: 318 10*3/uL (ref 150–400)
RBC: 4.88 MIL/uL (ref 4.22–5.81)
RDW: 12.5 % (ref 11.5–15.5)
WBC: 14.1 10*3/uL — ABNORMAL HIGH (ref 4.0–10.5)
nRBC: 0 % (ref 0.0–0.2)

## 2020-12-22 LAB — URINE DRUG SCREEN, QUALITATIVE (ARMC ONLY)
Amphetamines, Ur Screen: NOT DETECTED
Barbiturates, Ur Screen: NOT DETECTED
Benzodiazepine, Ur Scrn: NOT DETECTED
Cannabinoid 50 Ng, Ur ~~LOC~~: POSITIVE — AB
Cocaine Metabolite,Ur ~~LOC~~: POSITIVE — AB
MDMA (Ecstasy)Ur Screen: NOT DETECTED
Methadone Scn, Ur: NOT DETECTED
Opiate, Ur Screen: NOT DETECTED
Phencyclidine (PCP) Ur S: NOT DETECTED
Tricyclic, Ur Screen: NOT DETECTED

## 2020-12-22 LAB — COMPREHENSIVE METABOLIC PANEL
ALT: 25 U/L (ref 0–44)
AST: 21 U/L (ref 15–41)
Albumin: 4.8 g/dL (ref 3.5–5.0)
Alkaline Phosphatase: 43 U/L (ref 38–126)
Anion gap: 13 (ref 5–15)
BUN: 11 mg/dL (ref 6–20)
CO2: 21 mmol/L — ABNORMAL LOW (ref 22–32)
Calcium: 9.4 mg/dL (ref 8.9–10.3)
Chloride: 105 mmol/L (ref 98–111)
Creatinine, Ser: 0.8 mg/dL (ref 0.61–1.24)
GFR, Estimated: >60 mL/min (ref >60)
Glucose, Bld: 77 mg/dL (ref 70–99)
Potassium: 4.1 mmol/L (ref 3.5–5.1)
Sodium: 139 mmol/L (ref 135–145)
Total Bilirubin: 1.2 mg/dL (ref 0.3–1.2)
Total Protein: 8.2 g/dL — ABNORMAL HIGH (ref 6.5–8.1)

## 2020-12-22 LAB — ETHANOL: Alcohol, Ethyl (B): 40 mg/dL — ABNORMAL HIGH (ref <10)

## 2020-12-22 LAB — TROPONIN I (HIGH SENSITIVITY)
Troponin I (High Sensitivity): 2 ng/L (ref <18)
Troponin I (High Sensitivity): <2 ng/L (ref <18)

## 2020-12-22 MED ORDER — SODIUM CHLORIDE 0.9 % IV BOLUS
1000.0000 mL | Freq: Once | INTRAVENOUS | Status: AC
  Administered 2020-12-22: 1000 mL via INTRAVENOUS

## 2020-12-22 MED ORDER — ONDANSETRON HCL 4 MG PO TABS: 4.0000 mg | ORAL_TABLET | Freq: Every day | ORAL | 0 refills | Status: DC | PRN

## 2020-12-22 MED ORDER — LORAZEPAM 2 MG/ML IJ SOLN
0.5000 mg | Freq: Once | INTRAMUSCULAR | Status: AC
  Administered 2020-12-22: 0.5 mg via INTRAVENOUS
  Filled 2020-12-22: qty 1
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
## 2020-12-22 NOTE — ED Provider Notes (Signed)
-----------------------------------------   8:41 AM on 12/22/2020 -----------------------------------------  Patient care assumed from Dr. Dolores Frame.  Patient's work-up is reassuring including negative troponin x2.  Patient will be discharged home with PCP follow-up.  Discussed my typical chest pain return precautions.   Minna Antis, MD 12/22/20 [phone]
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
## 2021-04-30 ENCOUNTER — Other Ambulatory Visit: Payer: Self-pay

## 2021-04-30 ENCOUNTER — Emergency Department
Admission: EM | Admit: 2021-04-30 | Discharge: 2021-04-30 | Disposition: A | Discharge status: Home / Self Care | Payer: Self-pay | Attending: Emergency Medicine | Admitting: Emergency Medicine

## 2021-04-30 DIAGNOSIS — Z79899 Other long term (current) drug therapy: Secondary | ICD-10-CM | POA: Insufficient documentation

## 2021-04-30 DIAGNOSIS — F1510 Other stimulant abuse, uncomplicated: Secondary | ICD-10-CM

## 2021-04-30 DIAGNOSIS — Z20822 Contact with and (suspected) exposure to covid-19: Secondary | ICD-10-CM | POA: Insufficient documentation

## 2021-04-30 DIAGNOSIS — F1410 Cocaine abuse, uncomplicated: Secondary | ICD-10-CM

## 2021-04-30 DIAGNOSIS — F17210 Nicotine dependence, cigarettes, uncomplicated: Secondary | ICD-10-CM | POA: Insufficient documentation

## 2021-04-30 DIAGNOSIS — F15951 Other stimulant use, unspecified with stimulant-induced psychotic disorder with hallucinations: Secondary | ICD-10-CM

## 2021-04-30 DIAGNOSIS — M6282 Rhabdomyolysis: Secondary | ICD-10-CM | POA: Insufficient documentation

## 2021-04-30 DIAGNOSIS — N179 Acute kidney failure, unspecified: Secondary | ICD-10-CM | POA: Insufficient documentation

## 2021-04-30 DIAGNOSIS — F29 Unspecified psychosis not due to a substance or known physiological condition: Secondary | ICD-10-CM | POA: Insufficient documentation

## 2021-04-30 DIAGNOSIS — F1910 Other psychoactive substance abuse, uncomplicated: Secondary | ICD-10-CM | POA: Insufficient documentation

## 2021-04-30 DIAGNOSIS — Y900 Blood alcohol level of less than 20 mg/100 ml: Secondary | ICD-10-CM | POA: Insufficient documentation

## 2021-04-30 LAB — COMPREHENSIVE METABOLIC PANEL
ALT: 19 U/L (ref 0–44)
AST: 21 U/L (ref 15–41)
Albumin: 4.9 g/dL (ref 3.5–5.0)
Alkaline Phosphatase: 49 U/L (ref 38–126)
Anion gap: 12 (ref 5–15)
BUN: 22 mg/dL — ABNORMAL HIGH (ref 6–20)
CO2: 26 mmol/L (ref 22–32)
Calcium: 10 mg/dL (ref 8.9–10.3)
Chloride: 97 mmol/L — ABNORMAL LOW (ref 98–111)
Creatinine, Ser: 1.28 mg/dL — ABNORMAL HIGH (ref 0.61–1.24)
GFR, Estimated: >60 mL/min (ref >60)
Glucose, Bld: 115 mg/dL — ABNORMAL HIGH (ref 70–99)
Potassium: 3.6 mmol/L (ref 3.5–5.1)
Sodium: 135 mmol/L (ref 135–145)
Total Bilirubin: 1.5 mg/dL — ABNORMAL HIGH (ref 0.3–1.2)
Total Protein: 8.6 g/dL — ABNORMAL HIGH (ref 6.5–8.1)

## 2021-04-30 LAB — CBC
HCT: 47.3 % (ref 39.0–52.0)
Hemoglobin: 16.6 g/dL (ref 13.0–17.0)
MCH: 32.8 pg (ref 26.0–34.0)
MCHC: 35.1 g/dL (ref 30.0–36.0)
MCV: 93.5 fL (ref 80.0–100.0)
Platelets: 369 10*3/uL (ref 150–400)
RBC: 5.06 MIL/uL (ref 4.22–5.81)
RDW: 12.6 % (ref 11.5–15.5)
WBC: 14.5 10*3/uL — ABNORMAL HIGH (ref 4.0–10.5)
nRBC: 0 % (ref 0.0–0.2)

## 2021-04-30 LAB — URINE DRUG SCREEN, QUALITATIVE (ARMC ONLY)
Amphetamines, Ur Screen: POSITIVE — AB
Barbiturates, Ur Screen: NOT DETECTED
Benzodiazepine, Ur Scrn: NOT DETECTED
Cannabinoid 50 Ng, Ur ~~LOC~~: POSITIVE — AB
Cocaine Metabolite,Ur ~~LOC~~: POSITIVE — AB
MDMA (Ecstasy)Ur Screen: NOT DETECTED
Methadone Scn, Ur: NOT DETECTED
Opiate, Ur Screen: NOT DETECTED
Phencyclidine (PCP) Ur S: NOT DETECTED
Tricyclic, Ur Screen: NOT DETECTED

## 2021-04-30 LAB — RESP PANEL BY RT-PCR (FLU A&B, COVID) ARPGX2
Influenza A by PCR: NEGATIVE
Influenza B by PCR: NEGATIVE
SARS Coronavirus 2 by RT PCR: NEGATIVE

## 2021-04-30 LAB — CK: Total CK: 523 U/L — ABNORMAL HIGH (ref 49–397)

## 2021-04-30 LAB — SALICYLATE LEVEL: Salicylate Lvl: <7 mg/dL — ABNORMAL LOW (ref 7.0–30.0)

## 2021-04-30 LAB — ETHANOL: Alcohol, Ethyl (B): <10 mg/dL (ref <10)

## 2021-04-30 LAB — ACETAMINOPHEN LEVEL: Acetaminophen (Tylenol), Serum: <10 ug/mL — ABNORMAL LOW (ref 10–30)

## 2021-04-30 MED ORDER — SODIUM CHLORIDE 0.9 % IV BOLUS (SEPSIS)
1000.0000 mL | Freq: Once | INTRAVENOUS | Status: AC
  Administered 2021-04-30: 1000 mL via INTRAVENOUS

## 2021-04-30 MED ORDER — LORAZEPAM 2 MG PO TABS
2.0000 mg | ORAL_TABLET | Freq: Once | ORAL | Status: AC
  Administered 2021-04-30: 2 mg via ORAL
  Filled 2021-04-30: qty 1

## 2021-04-30 MED ORDER — FAMOTIDINE 20 MG PO TABS
20.0000 mg | ORAL_TABLET | Freq: Every day | ORAL | Status: DC
  Administered 2021-04-30: 20 mg via ORAL
  Filled 2021-04-30: qty 1
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
## 2021-04-30 NOTE — ED Notes (Addendum)
Security Officer April, brought back a cellphone from lobby and she stated that [place] brought it in to have her put it with pts belongings.

## 2021-04-30 NOTE — ED Notes (Addendum)
Called to pts room.  He states he had approx 80.00 in his pockets when he was handcuffed by sheriffs.  Pt then retracted the amount and said it was a 50.00 bill and he forgot he spent 40.00 on gas earlier.  I searched his clothing twice, and had Paulino Rily search his bag also.  A pair of shorts, socks, flip flops, cup with earring, and white tshirt were in his belonging bag.  No cash was found.  Officers called to see if they knew anything about cash or if it fell out in their car.  Per secretary Inetta Fermo, Officers stated they saw cash while he was dressing out and RN and Tech that dressed him out stated they saw no cash.

## 2021-04-30 NOTE — ED Notes (Addendum)
PATIENT STATED TO QUAD STAFF HE HAD MONEY ON HIM UPON ARRIVAL, DURING DRESSING OUT PROCESS RN KELLY G. AND NT Kerin Kren W. WERE PRESENT AS WELL AS THE  [place] SHERIFF OFFICER WHO BROUGHT PATIENT TO ED. THERE WAS NO MONEY PRESENTED DURING THE DRESSING OUT PROCESS THAT RN NOR TECH WERE NOTIFIED ABOUT AS WE DOCUMENTED WHAT PATIENT PRESENTED TO THE ED WITH AND SECURED HIS PERSONAL BELONGINGS. SHERIFF CALLED ABOUT MONEY AND STATED THAT PATIENT PULLED MONEY PUT OF SHORTS POCKET IN TRIAGE  HOWEVER RN NOR TECH WITNESSED SEEING ANY MONEY NOR WITNESSED PATIENT PULLING ANYTHING OUT HIS POCKETS. RN AND TECH ASSISTED PATIENT WITH REMOVAL OF DIAMOND EARRINGS AND PLACED THEM WITH PATIENTS BELONGINGS.

## 2021-04-30 NOTE — ED Provider Notes (Signed)
  Patient seen and evaluated by psychiatry who rescinds IVC and recommends outpatient management.  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  Outpatient follow-up provided.  We discussed return precautions for the ED.   Delton Prairie, MD 04/30/21 [phone]

## 2021-04-30 NOTE — Consult Note (Signed)
[facility] Face-to-Face Psychiatry Consult   Reason for Consult: Consult for 45 year old man brought to the hospital after presenting early this morning with paranoia and agitation Referring Physician: Katrinka Blazing Patient Identification: Nicolas Tapia MRN:  [id number] Principal Diagnosis: Amphetamine and psychostimulant-induced psychosis with hallucinations (HCC) Diagnosis:  Principal Problem:   Amphetamine and psychostimulant-induced psychosis with hallucinations (HCC) Active Problems:   Amphetamine abuse (HCC)   Cocaine abuse (HCC)   Total Time spent with patient: 1 hour  Subjective:   Nicolas Tapia is a 45 y.o. male patient admitted with "there is some weird stuff going on at that house".  HPI: Patient seen chart reviewed.  Patient was brought in after apparently authorities were called more than once to see him yesterday.  He was hiding in a closet talking about how people were chasing him he was agitated and acting bizarre and frightened.  Patient slept for several hours this morning.  He is now awake and cooperative.  He says that he has been spending time hanging out at a house with some guys who play cards and smoke methamphetamine.  He is evasive about his own drug use but seems to be implying that he was affected by secondhand smoke.  Admits cocaine and cannabis use.  Patient is currently denying suicidal or homicidal ideation.  Still has some vague paranoia.  Not hostile or aggressive.  Alert and oriented.  Does not appear to be having acute hallucinations.  Past Psychiatric History: No known past psychiatric history.  No hospitalization.  No medication.  Denies any history of suicidal or violent behavior.  Denies ever being in any substance abuse treatment  Risk to Self:   Risk to Others:   Prior Inpatient Therapy:   Prior Outpatient Therapy:    Past Medical History:  Past Medical History:  Diagnosis Date  . GERD (gastroesophageal reflux disease)   . Heart murmur    ASYMPTOMATIC  .  History of methicillin resistant staphylococcus aureus (MRSA) 2014  . Lower back pain    SINCE FALL AT WORK  . Peptic ulcer     Past Surgical History:  Procedure Laterality Date  . APPENDECTOMY    . KNEE ARTHROSCOPY WITH MENISCAL REPAIR Left 12/11/2017   Procedure: KNEE ARTHROSCOPY WITH PARTIAL MEDIAL MENISECTOMY ,CHONDROPLASTY;  Surgeon: Signa Kell, MD;  Location: [facility] ORS;  Service: Orthopedics;  Laterality: Left;   Family History: No family history on file. Family Psychiatric  History: None reported Social History:  Social History   Substance and Sexual Activity  Alcohol Use Yes     Social History   Substance and Sexual Activity  Drug Use Not Currently  . Types: Marijuana, Cocaine   Comment: PT DENIES DURING PHONE INTERVIEW ON 11-27-17 BUT THIS WAS PREVIOUSLY IN EPIC    Social History   Socioeconomic History  . Marital status: Single    Spouse name: Not on file  . Number of children: Not on file  . Years of education: Not on file  . Highest education level: Not on file  Occupational History  . Not on file  Tobacco Use  . Smoking status: Every Day    Packs/day: 1.00    Years: 20.00    Pack years: 20.00    Types: Cigarettes  . Smokeless tobacco: Never  Vaping Use  . Vaping Use: Every day  Substance and Sexual Activity  . Alcohol use: Yes  . Drug use: Not Currently    Types: Marijuana, Cocaine    Comment: PT DENIES DURING PHONE INTERVIEW  ON 11-27-17 BUT THIS WAS PREVIOUSLY IN EPIC  . Sexual activity: Not on file  Other Topics Concern  . Not on file  Social History Narrative  . Not on file   Social Determinants of Health   Financial Resource Strain: Not on file  Food Insecurity: Not on file  Transportation Needs: Not on file  Physical Activity: Not on file  Stress: Not on file  Social Connections: Not on file   Additional Social History:    Allergies:  No Known Allergies  Labs:  Results for orders placed or performed during the hospital encounter of  04/30/21 (from the past 48 hour(s))  Comprehensive metabolic panel     Status: Abnormal   Collection Time: 04/30/21  5:00 AM  Result Value Ref Range   Sodium 135 135 - 145 mmol/L   Potassium 3.6 3.5 - 5.1 mmol/L   Chloride 97 (L) 98 - 111 mmol/L   CO2 26 22 - 32 mmol/L   Glucose, Bld 115 (H) 70 - 99 mg/dL    Comment: Glucose reference range applies only to samples taken after fasting for at least 8 hours.   BUN 22 (H) 6 - 20 mg/dL   Creatinine, Ser 6.96 (H) 0.61 - 1.24 mg/dL   Calcium 29.5 8.9 - 28.4 mg/dL   Total Protein 8.6 (H) 6.5 - 8.1 g/dL   Albumin 4.9 3.5 - 5.0 g/dL   AST 21 15 - 41 U/L   ALT 19 0 - 44 U/L   Alkaline Phosphatase 49 38 - 126 U/L   Total Bilirubin 1.5 (H) 0.3 - 1.2 mg/dL   GFR, Estimated >13 >24 mL/min    Comment: (NOTE) Calculated using the CKD-EPI Creatinine Equation (2021)    Anion gap 12 5 - 15    Comment: Performed at [facility], [address] [place], [place] [address]  Ethanol     Status: None   Collection Time: 04/30/21  5:00 AM  Result Value Ref Range   Alcohol, Ethyl (B) <10 <10 mg/dL    Comment: (NOTE) Lowest detectable limit for serum alcohol is 10 mg/dL.  For medical purposes only. Performed at [facility], [address] [place], [place] [address]   cbc     Status: Abnormal   Collection Time: 04/30/21  5:00 AM  Result Value Ref Range   WBC 14.5 (H) 4.0 - 10.5 K/uL   RBC 5.06 4.22 - 5.81 MIL/uL   Hemoglobin 16.6 13.0 - 17.0 g/dL   HCT 64.4 03.4 - 74.2 %   MCV 93.5 80.0 - 100.0 fL   MCH 32.8 26.0 - 34.0 pg   MCHC 35.1 30.0 - 36.0 g/dL   RDW 59.5 63.8 - 75.6 %   Platelets 369 150 - 400 K/uL   nRBC 0.0 0.0 - 0.2 %    Comment: Performed at [facility], [address] [place], [place] [address]  Urine Drug Screen, Qualitative     Status: Abnormal   Collection Time: 04/30/21  5:00 AM  Result Value Ref Range   Tricyclic, Ur Screen NONE DETECTED NONE DETECTED   Amphetamines, Ur Screen POSITIVE (A)  NONE DETECTED   MDMA (Ecstasy)Ur Screen NONE DETECTED NONE DETECTED   Cocaine Metabolite,Ur [place] POSITIVE (A) NONE DETECTED   Opiate, Ur Screen NONE DETECTED NONE DETECTED   Phencyclidine (PCP) Ur S NONE DETECTED NONE DETECTED   Cannabinoid 50 Ng, Ur [place] POSITIVE (A) NONE DETECTED   Barbiturates, Ur Screen NONE DETECTED NONE DETECTED  Benzodiazepine, Ur Scrn NONE DETECTED NONE DETECTED   Methadone Scn, Ur NONE DETECTED NONE DETECTED    Comment: (NOTE) Tricyclics + metabolites, urine    Cutoff 1000 ng/mL Amphetamines + metabolites, urine  Cutoff 1000 ng/mL MDMA (Ecstasy), urine              Cutoff 500 ng/mL Cocaine Metabolite, urine          Cutoff 300 ng/mL Opiate + metabolites, urine        Cutoff 300 ng/mL Phencyclidine (PCP), urine         Cutoff 25 ng/mL Cannabinoid, urine                 Cutoff 50 ng/mL Barbiturates + metabolites, urine  Cutoff 200 ng/mL Benzodiazepine, urine              Cutoff 200 ng/mL Methadone, urine                   Cutoff 300 ng/mL  The urine drug screen provides only a preliminary, unconfirmed analytical test result and should not be used for non-medical purposes. Clinical consideration and professional judgment should be applied to any positive drug screen result due to possible interfering substances. A more specific alternate chemical method must be used in order to obtain a confirmed analytical result. Gas chromatography / mass spectrometry (GC/MS) is the preferred confirm atory method. Performed at [facility], [address] [place], [place] [address]   Acetaminophen level     Status: Abnormal   Collection Time: 04/30/21  5:00 AM  Result Value Ref Range   Acetaminophen (Tylenol), Serum <10 (L) 10 - 30 ug/mL    Comment: (NOTE) Therapeutic concentrations vary significantly. A range of 10-30 ug/mL  may be an effective concentration for many patients. However, some  are best treated at concentrations outside of this  range. Acetaminophen concentrations >150 ug/mL at 4 hours after ingestion  and >50 ug/mL at 12 hours after ingestion are often associated with  toxic reactions.  Performed at [facility], [address] [place], [place] [address]   Salicylate level     Status: Abnormal   Collection Time: 04/30/21  5:00 AM  Result Value Ref Range   Salicylate Lvl <7.0 (L) 7.0 - 30.0 mg/dL    Comment: Performed at [facility], [address] [place], [place] [address]  CK     Status: Abnormal   Collection Time: 04/30/21  5:00 AM  Result Value Ref Range   Total CK 523 (H) 49 - 397 U/L    Comment: Performed at [facility], [address] [place], [place] [address]  Resp Panel by RT-PCR (Flu A&B, Covid) Nasopharyngeal Swab     Status: None   Collection Time: 04/30/21  5:32 AM   Specimen: Nasopharyngeal Swab; Nasopharyngeal(NP) swabs in vial transport medium  Result Value Ref Range   SARS Coronavirus 2 by RT PCR NEGATIVE NEGATIVE    Comment: (NOTE) SARS-CoV-2 target nucleic acids are NOT DETECTED.  The SARS-CoV-2 RNA is generally detectable in upper respiratory specimens during the acute phase of infection. The lowest concentration of SARS-CoV-2 viral copies this assay can detect is 138 copies/mL. A negative result does not preclude SARS-Cov-2 infection and should not be used as the sole basis for treatment or other patient management decisions. A negative result may occur with  improper specimen collection/handling, submission of specimen other than nasopharyngeal swab, presence of viral mutation(s) within the areas targeted by this  assay, and inadequate number of viral copies(<138 copies/mL). A negative result must be combined with clinical observations, patient history, and epidemiological information. The expected result is Negative.  Fact Sheet for Patients:  BloggerCourse.com  Fact Sheet for Healthcare Providers:   SeriousBroker.it  This test is no t yet approved or cleared by the [place] FDA and  has been authorized for detection and/or diagnosis of SARS-CoV-2 by FDA under an Emergency Use Authorization (EUA). This EUA will remain  in effect (meaning this test can be used) for the duration of the COVID-19 declaration under Section 564(b)(1) of the Act, 21 U.S.C.section 360bbb-3(b)(1), unless the authorization is terminated  or revoked sooner.       Influenza A by PCR NEGATIVE NEGATIVE   Influenza B by PCR NEGATIVE NEGATIVE    Comment: (NOTE) The Xpert Xpress SARS-CoV-2/FLU/RSV plus assay is intended as an aid in the diagnosis of influenza from Nasopharyngeal swab specimens and should not be used as a sole basis for treatment. Nasal washings and aspirates are unacceptable for Xpert Xpress SARS-CoV-2/FLU/RSV testing.  Fact Sheet for Patients: BloggerCourse.com  Fact Sheet for Healthcare Providers: SeriousBroker.it  This test is not yet approved or cleared by the [place] FDA and has been authorized for detection and/or diagnosis of SARS-CoV-2 by FDA under an Emergency Use Authorization (EUA). This EUA will remain in effect (meaning this test can be used) for the duration of the COVID-19 declaration under Section 564(b)(1) of the Act, 21 U.S.C. section 360bbb-3(b)(1), unless the authorization is terminated or revoked.  Performed at [facility], [address], [place], [place] [address]     Current Facility-Administered Medications  Medication Dose Route Frequency Provider Last Rate Last Admin  . famotidine (PEPCID) tablet 20 mg  20 mg Oral Daily Ward, Kristen N, DO   20 mg at 04/30/21 0932   Current Outpatient Medications  Medication Sig Dispense Refill  . ondansetron (ZOFRAN) 4 MG tablet Take 1 tablet (4 mg total) by mouth daily as needed for nausea or vomiting. 20 tablet 0  .  ranitidine (ZANTAC) 150 MG tablet Take 150 mg by mouth as needed for heartburn.      Musculoskeletal: Strength & Muscle Tone: within normal limits Gait & Station: normal Patient leans: N/A            Psychiatric Specialty Exam:  Presentation  General Appearance:  No data recorded Eye Contact: No data recorded Speech: No data recorded Speech Volume: No data recorded Handedness: No data recorded  Mood and Affect  Mood: No data recorded Affect: No data recorded  Thought Process  Thought Processes: No data recorded Descriptions of Associations:No data recorded Orientation:No data recorded Thought Content:No data recorded History of Schizophrenia/Schizoaffective disorder:No data recorded Duration of Psychotic Symptoms:No data recorded Hallucinations:No data recorded Ideas of Reference:No data recorded Suicidal Thoughts:No data recorded Homicidal Thoughts:No data recorded  Sensorium  Memory: No data recorded Judgment: No data recorded Insight: No data recorded  Executive Functions  Concentration: No data recorded Attention Span: No data recorded Recall: No data recorded Fund of Knowledge: No data recorded Language: No data recorded  Psychomotor Activity  Psychomotor Activity: No data recorded  Assets  Assets: No data recorded  Sleep  Sleep: No data recorded  Physical Exam: Physical Exam Vitals and nursing note reviewed.  Constitutional:      Appearance: Normal appearance.  HENT:     Head: Normocephalic and atraumatic.     Mouth/Throat:     Pharynx: Oropharynx is clear.  Eyes:     Pupils: Pupils are equal, round, and reactive to light.  Cardiovascular:     Rate and Rhythm: Normal rate and regular rhythm.  Pulmonary:     Effort: Pulmonary effort is normal.     Breath sounds: Normal breath sounds.  Abdominal:     General: Abdomen is flat.     Palpations: Abdomen is soft.  Musculoskeletal:        General: Normal range of  motion.  Skin:    General: Skin is warm and dry.  Neurological:     General: No focal deficit present.     Mental Status: He is alert. Mental status is at baseline.  Psychiatric:        Attention and Perception: Attention normal.        Mood and Affect: Mood is anxious.        Speech: Speech normal.        Behavior: Behavior is cooperative.        Thought Content: Thought content is paranoid. Thought content does not include homicidal or suicidal ideation.        Cognition and Memory: He exhibits impaired recent memory.        Judgment: Judgment is impulsive.   Review of Systems  Constitutional: Negative.   HENT: Negative.    Eyes: Negative.   Respiratory: Negative.    Cardiovascular: Negative.   Gastrointestinal: Negative.   Musculoskeletal: Negative.   Skin: Negative.   Neurological: Negative.   Psychiatric/Behavioral:  Positive for hallucinations, memory loss and substance abuse. Negative for depression and suicidal ideas. The patient is nervous/anxious and has insomnia.   Blood pressure (!) 134/100, pulse 98, temperature 98.6 F (37 C), temperature source Oral, resp. rate 20, SpO2 99 %. There is no height or weight on file to calculate BMI.  Treatment Plan Summary: Plan 45 year old man with no past psychiatric history presents with agitated paranoid behavior in the context of amphetamine and cocaine abuse.  Improved now after hours of rest and hydration.  No indication of any danger to himself or anyone else.  No acute suicidal ideation.  Patient was counseled about the dangers of drug abuse especially methamphetamine abuse causing paranoia.  Strongly encouraged to stay away from people who are using those drugs.  He is given information with referral to [facility] for outpatient substance abuse treatment.  Case reviewed with ER physician.  Patient does not meet commitment criteria and can be discharged from the emergency room.  Disposition: No evidence of imminent risk to self or others  at present.   Patient does not meet criteria for psychiatric inpatient admission. Supportive therapy provided about ongoing stressors. Discussed crisis plan, support from social network, calling 911, coming to the Emergency Department, and calling Suicide Hotline.  Mordecai RasmussenJohn Beatrix Breece, MD 04/30/2021 1:42 PM

## 2021-04-30 NOTE — ED Notes (Signed)
Pt given breakfast tray

## 2021-04-30 NOTE — ED Notes (Signed)
I searched the pt's belongings after being notified he states he was missing $80, spoke with Misty Stanley EDT, see her note from 0602. No cash found after thorough search of belongings. Spoke with triage RN Tresa Endo and EDT Georgiann Hahn who were present when the pt was dressed out.  The did not see any cash on the pt and when the patient undressed to be dressed in hospital attire, the patient himself put his belongings in the bag.  There was 1 sheriff present while the pt was getting dressed out but he had his back turned at that time.    Per EDT Georgiann Hahn and Tresa Endo RN, pt was taken to the bathroom by [facility] there was no staff member present at this time when the pt was in the bathroom and the patient had not been changed out into hospital attire at that time. I walked up through triage and checked the bathroom as well as the triage area in general and there was no cash found.

## 2021-04-30 NOTE — ED Notes (Signed)
IVC, pending consult 

## 2021-04-30 NOTE — ED Notes (Signed)
Pt given tray and drink

## 2021-04-30 NOTE — ED Notes (Signed)
Pt here under IVC by [facility], pt states he was at friends house drinking and smoking marijuana, pt states he was also at house where there was a "smoke room" with a "funny smell". Pt denies any SI/HI. Pt cooperative and calm at this time, discussed plan of car.  Pt ambulatory with steady gait.

## 2021-04-30 NOTE — ED Notes (Signed)
Pt given chocolate milk 

## 2021-04-30 NOTE — ED Provider Notes (Signed)
[facility] Emergency Department Provider Note  ____________________________________________   Event Date/Time   First MD Initiated Contact with Patient 04/30/21 0510     (approximate)  I have reviewed the triage vital signs and the nursing notes.   HISTORY  Chief Complaint Psychiatric Evaluation    HPI Nicolas Tapia is a 45 y.o. male with history of GERD who presents to the emergency department under IVC taken out by law enforcement.  Patient states that he was at a friend's house tonight drinking alcohol, smoking marijuana and playing cards when his friend's granddaughter came in the room and told him that there were people in the living room there with tasers that were there to get him.  He states he left the house and called the police.  Police reports that he has been agitated, hallucinating, paranoid.  Patient denies any psychiatric history.  No SI or HI.  He denies any stimulant use such as cocaine or methamphetamine.     Per IVC paperwork taken out by law enforcement "respondent has history of mental illness and reported to law enforcement tonight that he is not taking his prescribed medications.  Respondent called law enforcement multiple times today.  The first time they responded the respondent went inside of a closet in a bathroom because he alleges someone was trying to kill him.  He later reported to law enforcement that it had just been a dream.  Respondent later called 911 saying he was being held against his will.  He stopped his car in the middle of the roadway and began jumping up and down on his vehicle while still on the phone with dispatch.  Law enforcement does not believe this is normal behavior for the respondent based on previous encounters."     Past Medical History:  Diagnosis Date   GERD (gastroesophageal reflux disease)    Heart murmur    ASYMPTOMATIC   History of methicillin resistant staphylococcus aureus (MRSA) 2014   Lower back  pain    SINCE FALL AT WORK   Peptic ulcer     There are no problems to display for this patient.   Past Surgical History:  Procedure Laterality Date   APPENDECTOMY     KNEE ARTHROSCOPY WITH MENISCAL REPAIR Left 12/11/2017   Procedure: KNEE ARTHROSCOPY WITH PARTIAL MEDIAL MENISECTOMY ,CHONDROPLASTY;  Surgeon: Signa Kell, MD;  Location: [facility] ORS;  Service: Orthopedics;  Laterality: Left;    Prior to Admission medications   Medication Sig Start Date End Date Taking? Authorizing Provider  ondansetron (ZOFRAN) 4 MG tablet Take 1 tablet (4 mg total) by mouth daily as needed for nausea or vomiting. 12/22/20   Minna Antis, MD  ranitidine (ZANTAC) 150 MG tablet Take 150 mg by mouth as needed for heartburn.    [provider]    Allergies Patient has no known allergies.  No family history on file.  Social History Social History   Tobacco Use   Smoking status: Every Day    Packs/day: 1.00    Years: 20.00    Pack years: 20.00    Types: Cigarettes   Smokeless tobacco: Never  Vaping Use   Vaping Use: Every day  Substance Use Topics   Alcohol use: Yes   Drug use: Not Currently    Types: Marijuana, Cocaine    Comment: PT DENIES DURING PHONE INTERVIEW ON 11-27-17 BUT THIS WAS PREVIOUSLY IN EPIC    Review of Systems Constitutional: No fever. Eyes: No visual changes. ENT: No  sore throat. Cardiovascular: Denies chest pain. Respiratory: Denies shortness of breath. Gastrointestinal: No nausea, vomiting, diarrhea. Genitourinary: Negative for dysuria. Musculoskeletal: Negative for back pain. Skin: Negative for rash. Neurological: Negative for focal weakness or numbness.  ____________________________________________   PHYSICAL EXAM:  VITAL SIGNS: ED Triage Vitals  Enc Vitals Group     BP 04/30/21 0455 (!) 134/100     Pulse Rate 04/30/21 0455 98     Resp 04/30/21 0455 20     Temp 04/30/21 0455 98.6 F (37 C)     Temp Source 04/30/21 0455 Oral     SpO2  04/30/21 0455 99 %     Weight --      Height --      Head Circumference --      Peak Flow --      Pain Score 04/30/21 0456 0     Pain Loc --      Pain Edu? --      Excl. in GC? --    CONSTITUTIONAL: Alert and oriented and responds appropriately to questions.  Intermittently agitated but redirectable HEAD: Normocephalic EYES: Conjunctivae clear, pupils appear equal, EOM appear intact ENT: normal nose; moist mucous membranes NECK: Supple, normal ROM CARD: RRR; S1 and S2 appreciated; no murmurs, no clicks, no rubs, no gallops RESP: Normal chest excursion without splinting or tachypnea; breath sounds clear and equal bilaterally; no wheezes, no rhonchi, no rales, no hypoxia or respiratory distress, speaking full sentences ABD/GI: Normal bowel sounds; non-distended; soft, non-tender, no rebound, no guarding, no peritoneal signs, no hepatosplenomegaly BACK: The back appears normal EXT: Normal ROM in all joints; no deformity noted, no edema; no cyanosis SKIN: Normal color for age and race; warm; no rash on exposed skin NEURO: Moves all extremities equally, normal gait, normal speech PSYCH: Intermittently agitated, raising voice but is redirectable.  Denies SI or HI.  Does not appear to be responding to internal stimuli but does appear psychotic.  ____________________________________________   LABS (all labs ordered are listed, but only abnormal results are displayed)  Labs Reviewed  COMPREHENSIVE METABOLIC PANEL - Abnormal; Notable for the following components:      Result Value   Chloride 97 (*)    Glucose, Bld 115 (*)    BUN 22 (*)    Creatinine, Ser 1.28 (*)    Total Protein 8.6 (*)    Total Bilirubin 1.5 (*)    All other components within normal limits  CBC - Abnormal; Notable for the following components:   WBC 14.5 (*)    All other components within normal limits  URINE DRUG SCREEN, QUALITATIVE ([facility] ONLY) - Abnormal; Notable for the following components:   Amphetamines, Ur  Screen POSITIVE (*)    Cocaine Metabolite,Ur [place] POSITIVE (*)    Cannabinoid 50 Ng, Ur [place] POSITIVE (*)    All other components within normal limits  ACETAMINOPHEN LEVEL - Abnormal; Notable for the following components:   Acetaminophen (Tylenol), Serum <10 (*)    All other components within normal limits  SALICYLATE LEVEL - Abnormal; Notable for the following components:   Salicylate Lvl <7.0 (*)    All other components within normal limits  CK - Abnormal; Notable for the following components:   Total CK 523 (*)    All other components within normal limits  RESP PANEL BY RT-PCR (FLU A&B, COVID) ARPGX2  ETHANOL   ____________________________________________  EKG   ____________________________________________  RADIOLOGY I, Jasenia Weilbacher, personally viewed and evaluated these images (plain radiographs) as part  of my medical decision making, as well as reviewing the written report by the radiologist.  ED MD interpretation:    Official radiology report(s): No results found.  ____________________________________________   PROCEDURES  Procedure(s) performed (including Critical Care):  Procedures    ____________________________________________   INITIAL IMPRESSION / ASSESSMENT AND PLAN / ED COURSE  As part of my medical decision making, I reviewed the following data within the electronic MEDICAL RECORD NUMBER Nursing notes reviewed and incorporated, Labs reviewed , Old chart reviewed, A consult was requested and obtained from this/these consultant(s) PSYCH, and Notes from prior ED visits         Patient here with psychosis.  He is under full IVC at this time.  No known past medical history of psychiatric illness.  This could be substance related.  He does report drinking alcohol and smoking marijuana tonight.  We will obtain urine drug screen.  Screening labs pending.  He is very agitated but redirectable.  Agreeable to taking oral Ativan to help with his agitation.  Will consult  TTS, psychiatry for further disposition.  ED PROGRESS  Patient's drug screen is positive for amphetamines, cocaine and marijuana.  I suspect that his psychosis is substance related.  He does have mildly elevated creatinine and CK level today.  We will encourage oral fluids but we will also give him 2 L of IV fluids in the ED.  Awaiting psychiatric evaluation for further disposition.  I reviewed all nursing notes and pertinent previous records as available.  I have reviewed and interpreted any EKGs, lab and urine results, imaging (as available).  ____________________________________________   FINAL CLINICAL IMPRESSION(S) / ED DIAGNOSES  Final diagnoses:  Psychosis, unspecified psychosis type (HCC)  Substance abuse (HCC)  AKI (acute kidney injury) (HCC)  Non-traumatic rhabdomyolysis     ED Discharge Orders     None       *Please note:  Nicolas Tapia was evaluated in Emergency Department on 04/30/2021 for the symptoms described in the history of present illness. He was evaluated in the context of the global COVID-19 pandemic, which necessitated consideration that the patient might be at risk for infection with the SARS-CoV-2 virus that causes COVID-19. Institutional protocols and algorithms that pertain to the evaluation of patients at risk for COVID-19 are in a state of rapid change based on information released by regulatory bodies including the CDC and federal and state organizations. These policies and algorithms were followed during the patient's care in the ED.  Some ED evaluations and interventions may be delayed as a result of limited staffing during and the pandemic.*   Note:  This document was prepared using Dragon voice recognition software and may include unintentional dictation errors.    Chelbie Jarnagin, Layla Maw, DO 04/30/21 [phone]

## 2021-04-30 NOTE — ED Notes (Signed)
Pt belonging bag retrieved from locked storage, and provided to pt in room.

## 2021-04-30 NOTE — ED Notes (Signed)
Pt given lunch tray and ginger ale. Awake at this time.

## 2021-07-14 IMAGING — DX DG CHEST 1V PORT
2 series · 2 of 2 positions shown · non-contrast
Comparison: 06/02/2018.

CLINICAL DATA: Chest pain.

EXAM:
PORTABLE CHEST 1 VIEW

[chest ap (1 of 2)]
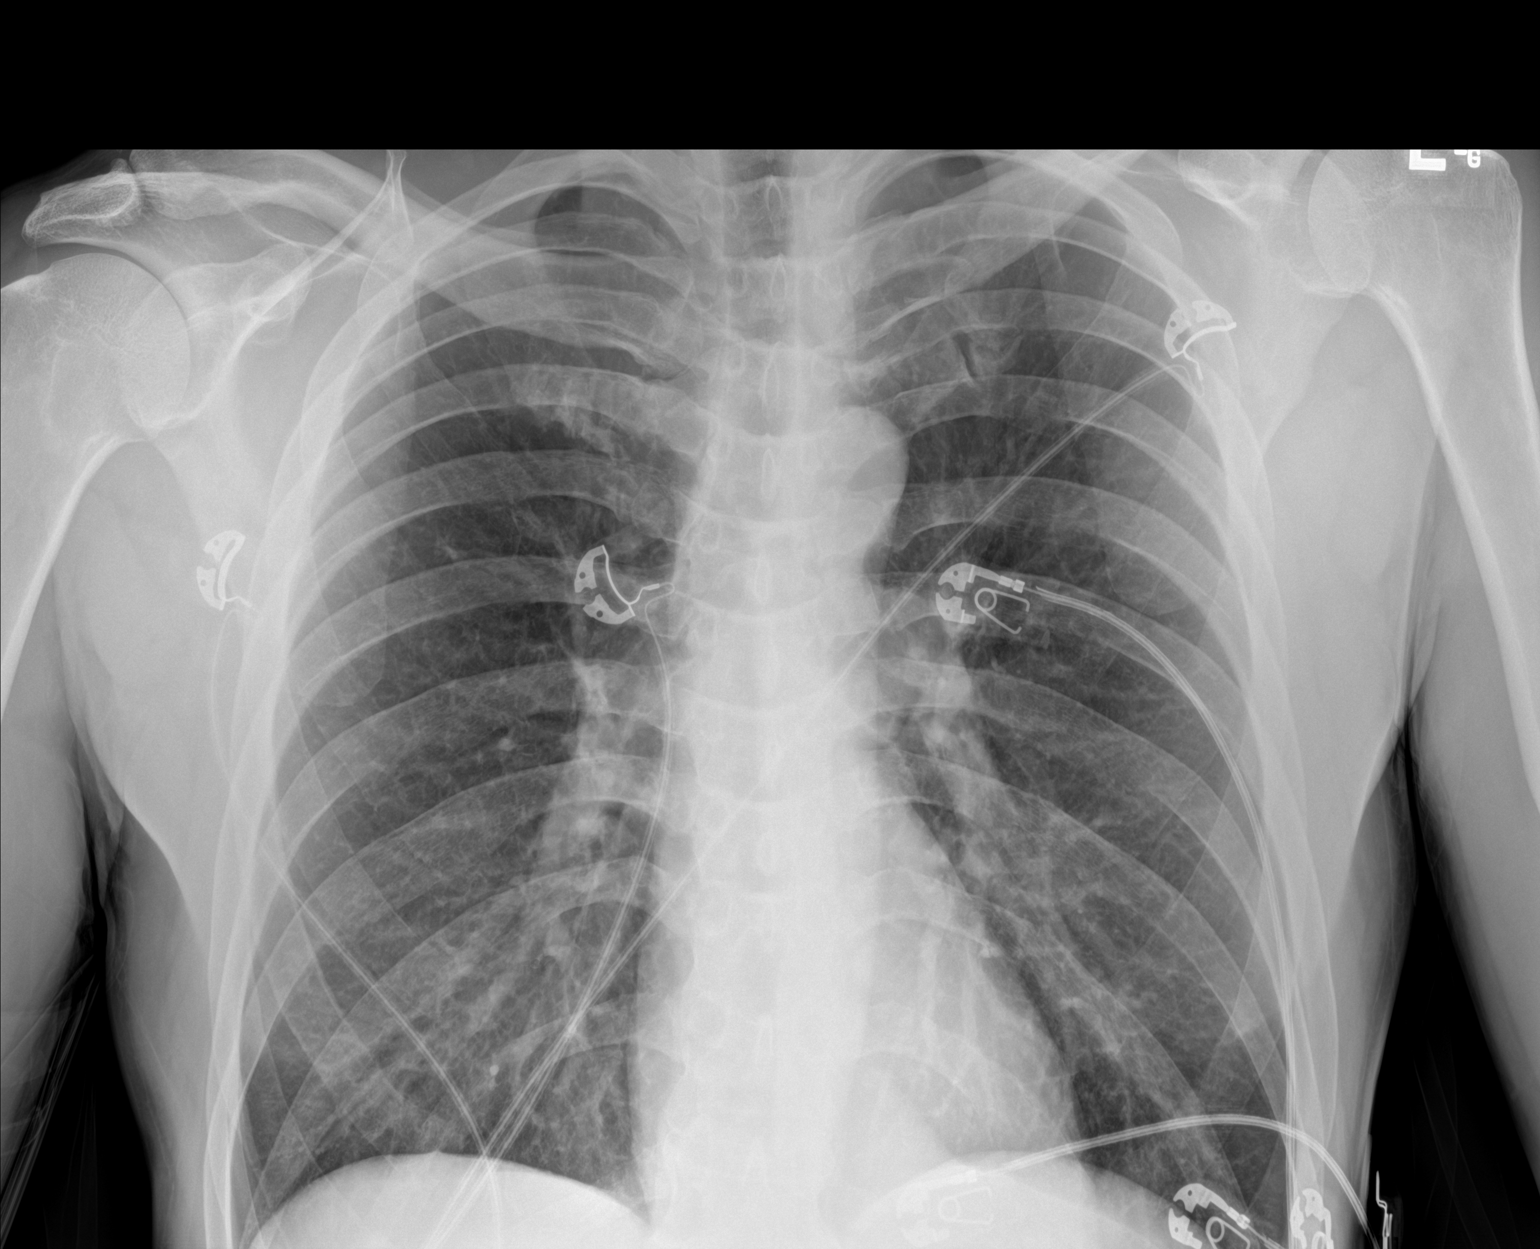

[chest ap (2 of 2)]
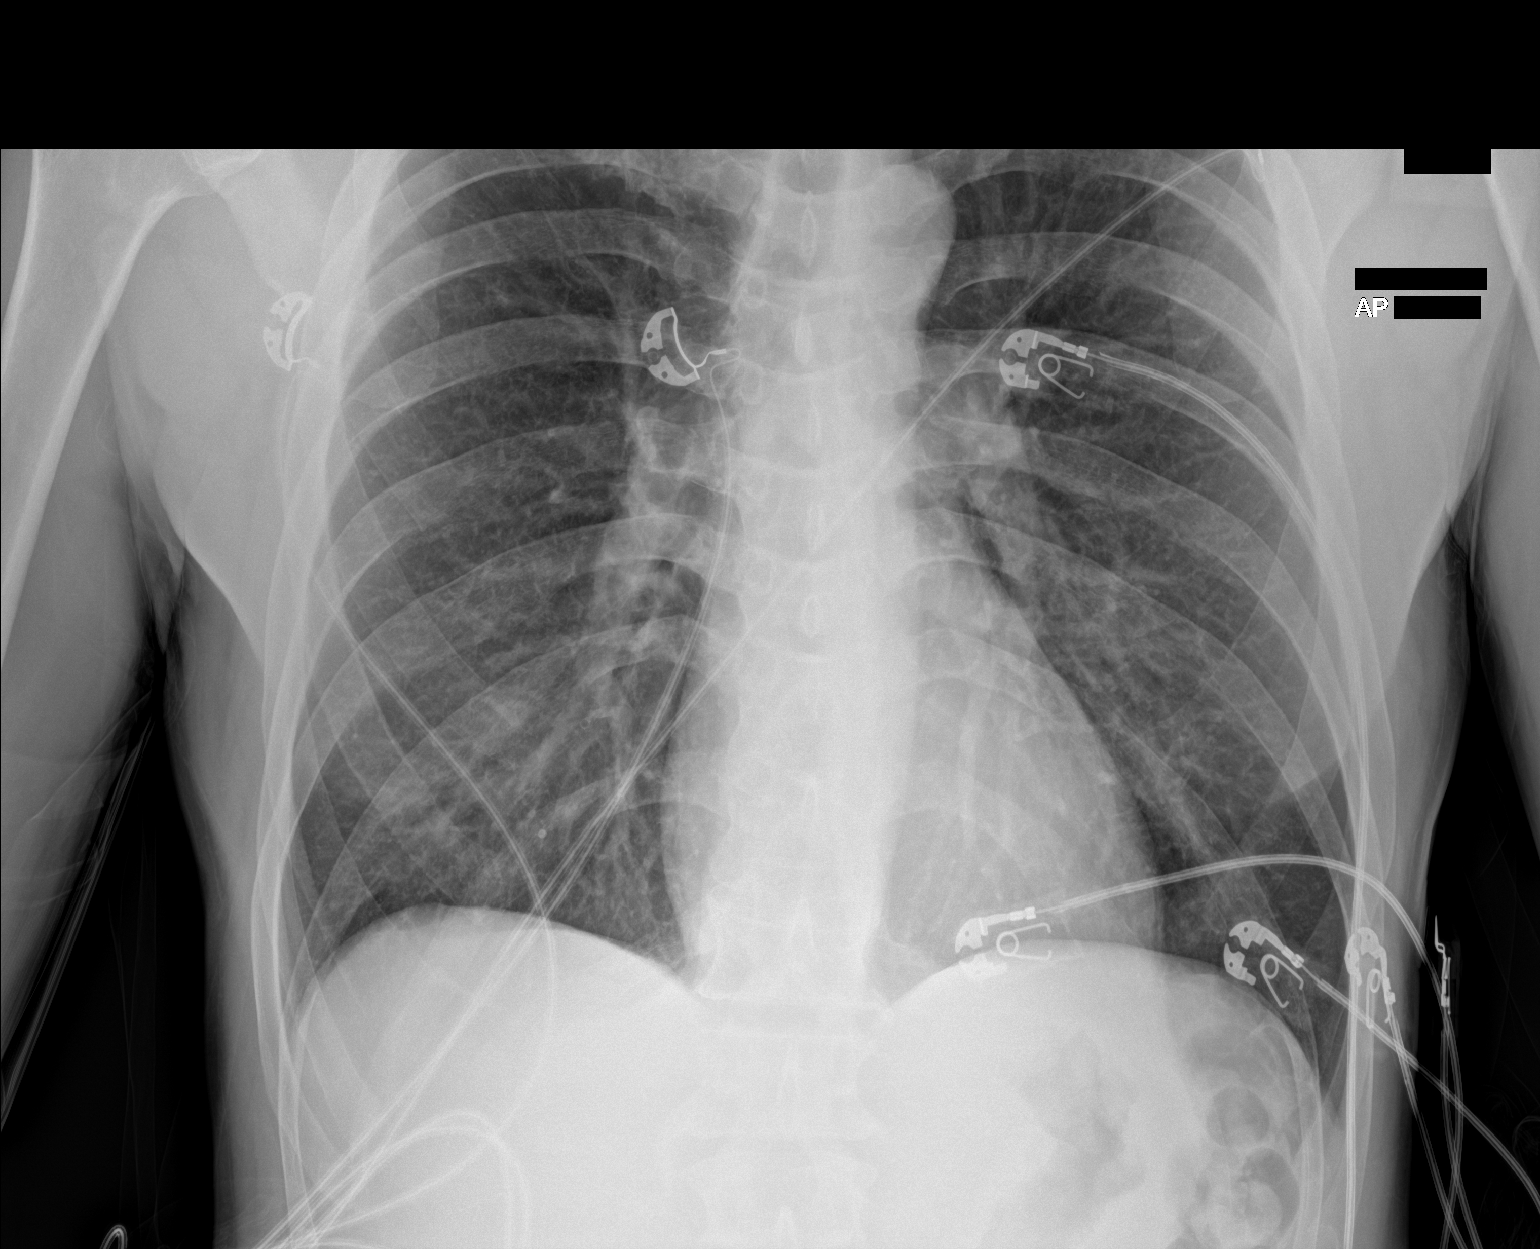

[2 of 2 positions shown; findings below may reference images not displayed]

FINDINGS: Mediastinum and hilar structures normal. Heart size normal. Lungs
are clear. No pleural effusion or pneumothorax. Mild thoracic spine
scoliosis.
IMPRESSION: No acute cardiopulmonary disease.

## 2022-01-23 ENCOUNTER — Ambulatory Visit
Admission: EM | Admit: 2022-01-23 | Discharge: 2022-01-23 | Disposition: A | Discharge status: Home / Self Care | Payer: Self-pay | Attending: Emergency Medicine | Admitting: Emergency Medicine

## 2022-01-23 ENCOUNTER — Encounter: Payer: Self-pay | Admitting: Emergency Medicine

## 2022-01-23 DIAGNOSIS — K2901 Acute gastritis with bleeding: Secondary | ICD-10-CM

## 2022-01-23 LAB — COMPREHENSIVE METABOLIC PANEL
ALT: 16 U/L (ref 0–44)
AST: 16 U/L (ref 15–41)
Albumin: 4.2 g/dL (ref 3.5–5.0)
Alkaline Phosphatase: 42 U/L (ref 38–126)
Anion gap: 9 (ref 5–15)
BUN: 12 mg/dL (ref 6–20)
CO2: 25 mmol/L (ref 22–32)
Calcium: 8.8 mg/dL — ABNORMAL LOW (ref 8.9–10.3)
Chloride: 102 mmol/L (ref 98–111)
Creatinine, Ser: 1.05 mg/dL (ref 0.61–1.24)
GFR, Estimated: >60 mL/min (ref >60)
Glucose, Bld: 97 mg/dL (ref 70–99)
Potassium: 3.6 mmol/L (ref 3.5–5.1)
Sodium: 136 mmol/L (ref 135–145)
Total Bilirubin: 0.4 mg/dL (ref 0.3–1.2)
Total Protein: 7.4 g/dL (ref 6.5–8.1)

## 2022-01-23 LAB — CBC WITH DIFFERENTIAL/PLATELET
Abs Immature Granulocytes: 0.05 10*3/uL (ref 0.00–0.07)
Basophils Absolute: 0.1 10*3/uL (ref 0.0–0.1)
Basophils Relative: 1 %
Eosinophils Absolute: 0.3 10*3/uL (ref 0.0–0.5)
Eosinophils Relative: 2 %
HCT: 45.5 % (ref 39.0–52.0)
Hemoglobin: 15.6 g/dL (ref 13.0–17.0)
Immature Granulocytes: 0 %
Lymphocytes Relative: 12 %
Lymphs Abs: 1.6 10*3/uL (ref 0.7–4.0)
MCH: 32.2 pg (ref 26.0–34.0)
MCHC: 34.3 g/dL (ref 30.0–36.0)
MCV: 93.8 fL (ref 80.0–100.0)
Monocytes Absolute: 1.3 10*3/uL — ABNORMAL HIGH (ref 0.1–1.0)
Monocytes Relative: 10 %
Neutro Abs: 10 10*3/uL — ABNORMAL HIGH (ref 1.7–7.7)
Neutrophils Relative %: 75 %
Platelets: 309 10*3/uL (ref 150–400)
RBC: 4.85 MIL/uL (ref 4.22–5.81)
RDW: 12.3 % (ref 11.5–15.5)
WBC: 13.3 10*3/uL — ABNORMAL HIGH (ref 4.0–10.5)
nRBC: 0 % (ref 0.0–0.2)

## 2022-01-23 LAB — LIPASE, BLOOD: Lipase: 30 U/L (ref 11–51)

## 2022-01-23 MED ORDER — ALUM & MAG HYDROXIDE-SIMETH 200-200-20 MG/5ML PO SUSP
30.0000 mL | Freq: Once | ORAL | Status: AC
  Administered 2022-01-23: 30 mL via ORAL

## 2022-01-23 MED ORDER — LIDOCAINE VISCOUS HCL 2 % MT SOLN
15.0000 mL | Freq: Once | OROMUCOSAL | Status: AC
  Administered 2022-01-23: 15 mL via ORAL

## 2022-01-23 MED ORDER — OMEPRAZOLE 20 MG PO CPDR: 20.0000 mg | DELAYED_RELEASE_CAPSULE | Freq: Every day | ORAL | 2 refills | Status: AC

## 2022-01-23 MED ORDER — LIDOCAINE VISCOUS HCL 2 % MT SOLN: 15.0000 mL | OROMUCOSAL | 0 refills | Status: AC | PRN

## 2022-01-23 NOTE — ED Provider Notes (Signed)
[facility]    CSN: [id number] Arrival date & time: 01/23/22  1132      History   Chief Complaint Chief Complaint  Patient presents with   Abdominal Pain    HPI Murray HodgkinsLouie Vick is a 46 y.o. male.   HPI  46 year old male here for evaluation of abdominal pain.  Patient reports that he has been experiencing intense burning in his upper abdomen for the past 2 days.  He does have a history of peptic ulcer disease and states this feels exactly like when he is peptic ulcers flare.  He is also endorsing some dark tarry stools.  He has had several episodes of vomiting for bilious fluid with 1 episode that contain small flecks that look like coffee grounds.  No bright red blood or significant coffee-ground emesis.  He states he has been feeling a little bit on the weak side but he denies any syncope.  He states he no longer smokes cigarettes but he does smoke marijuana daily.  He also denies any current alcohol consumption.  He did take BC powder the day before symptoms started but he does not use them regularly.  He does take famotidine daily for control of his peptic ulcers and GERD symptoms.  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 Authorizing Provider  omeprazole (PRILOSEC) 20 MG capsule Take 1 capsule (20 mg total) by mouth daily. 01/23/22  Yes Becky Augustayan, Stashia Sia, NP  ranitidine (ZANTAC) 150 MG tablet Take 150 mg by mouth as needed for heartburn.   Yes [provider]    Family History History reviewed. No pertinent family history.  Social History Social History   Tobacco Use   Smoking status: Every Day    Packs/day: 1.00    Years: 20.00    Pack years: 20.00    Types: Cigarettes   Smokeless tobacco: Never  Vaping Use   Vaping Use: Every day  Substance Use Topics   Alcohol use: Yes   Drug use: Not Currently    Types: Marijuana, Cocaine    Comment: PT DENIES DURING PHONE INTERVIEW ON 11-27-17 BUT THIS WAS PREVIOUSLY IN EPIC     Allergies   Patient has no known allergies.   Review of Systems Review of Systems  Constitutional:  Positive for fatigue. Negative for fever.  Gastrointestinal:  Positive for abdominal pain, blood in stool, nausea and vomiting.  Neurological:  Negative for syncope.  Psychiatric/Behavioral: Negative.      Physical Exam Triage Vital Signs ED Triage Vitals  Enc Vitals Group     BP 01/23/22 1210 119/73     Pulse Rate 01/23/22 1210 68  Resp 01/23/22 1210 15     Temp 01/23/22 1210 98.8 F (37.1 C)     Temp Source 01/23/22 1210 Oral     SpO2 01/23/22 1210 98 %     Weight 01/23/22 1208 165 lb (74.8 kg)     Height 01/23/22 1208 5\' 9"  (1.753 m)     Head Circumference --      Peak Flow --      Pain Score 01/23/22 1208 8     Pain Loc --      Pain Edu? --      Excl. in GC? --    No data found.  Updated Vital Signs BP 119/73 (BP Location: Left Arm)   Pulse 68   Temp 98.8 F (37.1 C) (Oral)   Resp 15   Ht 5\' 9"  (1.753 m)   Wt 165 lb (74.8 kg)   SpO2 98%   BMI 24.37 kg/m   Visual Acuity Right Eye Distance:   Left Eye Distance:   Bilateral Distance:    Right Eye Near:   Left Eye Near:     Bilateral Near:     Physical Exam Vitals and nursing note reviewed.  Constitutional:      Appearance: Normal appearance. He is not ill-appearing.  HENT:     Head: Normocephalic and atraumatic.  Cardiovascular:     Rate and Rhythm: Normal rate and regular rhythm.     Pulses: Normal pulses.     Heart sounds: Normal heart sounds. No murmur heard.   No friction rub. No gallop.  Pulmonary:     Effort: Pulmonary effort is normal.     Breath sounds: Normal breath sounds. No wheezing, rhonchi or rales.  Abdominal:     General: Abdomen is flat.     Palpations: Abdomen is soft.     Tenderness: There is abdominal tenderness. There is no guarding or rebound.  Skin:    General: Skin is warm and dry.     Capillary Refill: Capillary refill takes less than 2 seconds.     Coloration: Skin is not pale.  Neurological:     General: No focal deficit present.     Mental Status: He is alert and oriented to person, place, and time.  Psychiatric:        Mood and Affect: Mood normal.        Behavior: Behavior normal.        Thought Content: Thought content normal.        Judgment: Judgment normal.     UC Treatments / Results  Labs (all labs ordered are listed, but only abnormal results are displayed) Labs Reviewed  CBC WITH DIFFERENTIAL/PLATELET - Abnormal; Notable for the following components:      Result Value   WBC 13.3 (*)    Neutro Abs 10.0 (*)    Monocytes Absolute 1.3 (*)    All other components within normal limits  COMPREHENSIVE METABOLIC PANEL - Abnormal; Notable for the following components:   Calcium 8.8 (*)    All other components within normal limits  LIPASE, BLOOD    EKG   Radiology No results found.  Procedures Procedures (including critical care time)  Medications Ordered in UC Medications  alum & mag hydroxide-simeth (MAALOX/MYLANTA) 200-200-20 MG/5ML suspension 30 mL (30 mLs Oral Given 01/23/22 1249)    And  lidocaine (XYLOCAINE) 2 % viscous mouth solution 15  mL (15 mLs Oral Given 01/23/22 1249)    Initial Impression / Assessment and Plan / UC  Course  I have reviewed the triage vital signs and the nursing notes.  Pertinent labs & imaging results that were available during my care of the patient were reviewed by me and considered in my medical decision making (see chart for details).  Patient is a pleasant, nontoxic-appearing 46 year old male here for evaluation of upper abdominal pain that has been ongoing for the past 2 days as outlined in the HPI above.  His physical exam reveals a benign cardiopulmonary exam with S1-S2 heart sounds with regular rate and rhythm and lung sounds that are clear to auscultation in all fields.  Patient's abdomen is flat and soft with diffuse upper abdominal tenderness and some mild left lower quadrant abdominal tenderness.  No suprapubic or right lower quadrant tenderness on exam.  No guarding or rebound.  Patient does endorse having dark tarry stools.  He does have a history of GERD and peptic ulcer disease.  He has had bilious emesis and indicates that he did have a few flecks of coffee-ground looking emesis the last time he vomited but he has not had any gross hematemesis or large-volume coffee-ground emesis per his report.  His bilateral conjunctiva are not pale in appearance.  I suspect his peptic ulcer disease is has flared up, most likely secondary to his daily marijuana smoking and also his recent use of BC powders.  I have cautioned him against using any aspirin, BC powder, Aleve, or ibuprofen in the future.  I will check CBC and CMP.  I will also check a lipase given that he has some left upper quadrant tenderness.  I have also ordered a GI cocktail and I will reassess the patient symptoms.  If his blood work is stable plan to discharge patient home on omeprazole.  I have cautioned him that if he develops any vomiting for bright red blood or significant coffee-ground emesis, or if he starts passing blood through his rectum  he needs to go to the ER for evaluation.  Patient verbalizes understanding of same.  CBC shows a mildly elevated white blood cell count of 13.3 and elevated elevated neutrophil number of 10.  H&H is 15.6 and 45.5.  CMP shows a mildly decreased calcium of 8.8 but electrolytes, renal function, and transaminases are all normal.  Lipase is 30.  I will the patient's symptoms are coming from aggravation of his peptic ulcer disease and GERD.  I will discharge him home on omeprazole 20 mg daily.  I am going to encourage him to stop smoking marijuana daily and to continue to refrain from aspirin and NSAIDs.  If he develops any hematemesis or increased cough or emesis he needs to go to the ER for evaluation.   Final Clinical Impressions(s) / UC Diagnoses   Final diagnoses:  Acute gastritis with hemorrhage, unspecified gastritis type     Discharge Instructions      Your blood work today did not show any signs of liver inflammation, anemia, or pancreas inflammation.  I believe your pain is coming from a flareup of your peptic ulcer disease, possibly secondary to your marijuana smoking and your recent [facility] powder use.  Avoid using anymore BC powders, [employer] powders, aspirin, Aleve, or ibuprofen.  If you have pain you can use over-the-counter Tylenol.  Continue your Zantac 360 as you were previously taking it.  I am going to add on omeprazole 20 mg daily.  If you develop any bloody vomit, increased coffee-ground emesis, or start passing blood through your stools you need to go  to the ER for evaluation.     ED Prescriptions     Medication Sig Dispense Auth. Provider   omeprazole (PRILOSEC) 20 MG capsule Take 1 capsule (20 mg total) by mouth daily. 30 capsule Becky Augusta, NP      PDMP not reviewed this encounter.   Becky Augusta, NP 01/23/22 1327

## 2022-01-23 NOTE — ED Notes (Signed)
Patient states that his pain has improved rates 0/10

## 2022-01-23 NOTE — ED Triage Notes (Signed)
Patient c/o upper abdominal pain that started couple days ago.  Patient states that he has stomach ulcers.  Patient reports tick bite 2 days ago.  Patient denies fevers.  Patient reports some vomiting.

## 2022-01-23 NOTE — Discharge Instructions (Signed)
Your blood work today did not show any signs of liver inflammation, anemia, or pancreas inflammation.  I believe your pain is coming from a flareup of your peptic ulcer disease, possibly secondary to your marijuana smoking and your recent [facility] powder use.  Avoid using anymore BC powders, Good[employer] powders, aspirin, Aleve, or ibuprofen.  If you have pain you can use over-the-counter Tylenol.  Continue your Zantac 360 as you were previously taking it.  I am going to add on omeprazole 20 mg daily.  If you develop any bloody vomit, increased coffee-ground emesis, or start passing blood through your stools you need to go to the ER for evaluation.
